# Patient Record
Sex: Female | Born: 1945 | Race: White | Hispanic: No | Smoking: Former smoker
Health system: Southern US, Community
[De-identification: ages and names within clinical notes are randomized; demographics above are authoritative.]

## PROBLEM LIST (undated history)

## (undated) DIAGNOSIS — I10 Essential (primary) hypertension: Secondary | ICD-10-CM

## (undated) DIAGNOSIS — E079 Disorder of thyroid, unspecified: Secondary | ICD-10-CM

## (undated) HISTORY — PX: ABDOMINAL HYSTERECTOMY: SHX81

## (undated) HISTORY — PX: NASAL SINUS SURGERY: SHX719

## (undated) HISTORY — PX: BACK SURGERY: SHX140

---

## 1998-01-06 ENCOUNTER — Ambulatory Visit (HOSPITAL_COMMUNITY): Admission: RE | Admit: 1998-01-06 | Discharge: 1998-01-06 | Payer: Self-pay | Admitting: Gastroenterology

## 1998-12-29 ENCOUNTER — Other Ambulatory Visit: Admission: RE | Admit: 1998-12-29 | Discharge: 1998-12-29 | Payer: Self-pay | Admitting: Obstetrics and Gynecology

## 1999-02-15 ENCOUNTER — Ambulatory Visit (HOSPITAL_COMMUNITY): Admission: RE | Admit: 1999-02-15 | Discharge: 1999-02-15 | Payer: Self-pay | Admitting: Obstetrics and Gynecology

## 2000-05-13 ENCOUNTER — Other Ambulatory Visit: Admission: RE | Admit: 2000-05-13 | Discharge: 2000-05-13 | Payer: Self-pay | Admitting: Family Medicine

## 2000-06-28 ENCOUNTER — Encounter: Payer: Self-pay | Admitting: Internal Medicine

## 2000-06-28 ENCOUNTER — Emergency Department (HOSPITAL_COMMUNITY): Admission: RE | Admit: 2000-06-28 | Discharge: 2000-06-28 | Payer: Self-pay | Admitting: Internal Medicine

## 2000-08-08 ENCOUNTER — Ambulatory Visit (HOSPITAL_COMMUNITY): Admission: RE | Admit: 2000-08-08 | Discharge: 2000-08-08 | Payer: Self-pay

## 2000-08-30 ENCOUNTER — Encounter: Admission: RE | Admit: 2000-08-30 | Discharge: 2000-08-30 | Payer: Self-pay

## 2004-02-13 ENCOUNTER — Encounter: Admission: RE | Admit: 2004-02-13 | Discharge: 2004-02-13 | Payer: Self-pay | Admitting: Family Medicine

## 2005-01-18 ENCOUNTER — Encounter: Admission: RE | Admit: 2005-01-18 | Discharge: 2005-01-18 | Payer: Self-pay | Admitting: Orthopaedic Surgery

## 2009-08-24 ENCOUNTER — Emergency Department: Payer: Self-pay | Admitting: Emergency Medicine

## 2009-08-26 ENCOUNTER — Emergency Department: Payer: Self-pay

## 2009-08-28 ENCOUNTER — Ambulatory Visit: Payer: Self-pay | Admitting: Urology

## 2009-08-31 ENCOUNTER — Ambulatory Visit: Payer: Self-pay | Admitting: Urology

## 2009-09-14 ENCOUNTER — Ambulatory Visit: Payer: Self-pay | Admitting: Urology

## 2009-09-28 ENCOUNTER — Ambulatory Visit: Payer: Self-pay | Admitting: Urology

## 2010-05-26 ENCOUNTER — Encounter: Payer: Self-pay | Admitting: Orthopaedic Surgery

## 2015-10-17 ENCOUNTER — Other Ambulatory Visit: Payer: Self-pay | Admitting: Orthopaedic Surgery

## 2015-10-17 DIAGNOSIS — M545 Low back pain: Secondary | ICD-10-CM

## 2015-11-04 ENCOUNTER — Ambulatory Visit
Admission: RE | Admit: 2015-11-04 | Discharge: 2015-11-04 | Disposition: A | Discharge status: Home / Self Care | Payer: Medicare Other | Source: Ambulatory Visit | Attending: Orthopaedic Surgery | Admitting: Orthopaedic Surgery

## 2015-11-04 DIAGNOSIS — M545 Low back pain: Secondary | ICD-10-CM

## 2015-11-04 MED ORDER — GADOBENATE DIMEGLUMINE 529 MG/ML IV SOLN: 15.0000 mL | Freq: Once | INTRAVENOUS | Status: DC | PRN

## 2016-06-05 MED FILL — HYDROCODON-APAP 5-325: 5-325 | 15 days supply | Qty: 60 | Fill #0

## 2016-07-04 MED FILL — HYDROCODON-APAP 5-325: 5-325 | 15 days supply | Qty: 60 | Fill #0

## 2016-08-08 MED FILL — HYDROCODON-APAP 5-325: 5-325 | 15 days supply | Qty: 60 | Fill #0

## 2016-09-06 MED FILL — HYDROCODON-APAP 5-325: 5-325 | 15 days supply | Qty: 60 | Fill #0

## 2016-10-10 MED FILL — HYDROCODON-APAP 5-325: 5-325 | 15 days supply | Qty: 60 | Fill #0

## 2016-11-20 MED FILL — HYDROCODON-APAP 5-325: 5-325 | 15 days supply | Qty: 60 | Fill #0

## 2016-12-18 MED FILL — HYDROCODON-APAP 5-325: 5-325 | 15 days supply | Qty: 60 | Fill #0

## 2017-01-22 MED FILL — HYDROCODON-APAP 5-325: 5-325 | 15 days supply | Qty: 60 | Fill #0

## 2017-02-19 MED FILL — HYDROCODON-APAP 5-325: 5-325 | 15 days supply | Qty: 60 | Fill #0

## 2017-03-25 MED FILL — HYDROCODON-APAP 5-325: 5-325 | 15 days supply | Qty: 60 | Fill #0

## 2017-04-25 MED FILL — HYDROCODON-APAP 5-325: 5-325 | 15 days supply | Qty: 60 | Fill #0

## 2017-05-26 MED FILL — HYDROCODON-APAP 5-325: 5-325 | 15 days supply | Qty: 60 | Fill #0

## 2017-06-25 MED FILL — HYDROCODON-APAP 5-325: 5-325 | 15 days supply | Qty: 60 | Fill #0

## 2017-07-28 MED FILL — HYDROCODON-APAP 5-325: 5-325 | 15 days supply | Qty: 60 | Fill #0

## 2017-08-29 MED FILL — HYDROCODON-APAP 5-325: 5-325 | 15 days supply | Qty: 60 | Fill #0

## 2017-09-26 MED FILL — HYDROCODON-APAP 5-325: 5-325 | 15 days supply | Qty: 60 | Fill #0

## 2017-10-28 MED FILL — HYDROCODON-APAP 5-325: 5-325 | 15 days supply | Qty: 60 | Fill #0

## 2017-11-28 MED FILL — HYDROCODON-APAP 5-325: 5-325 | 15 days supply | Qty: 60 | Fill #0

## 2017-12-31 MED FILL — HYDROCODON-APAP 5-325: 5-325 | 15 days supply | Qty: 60 | Fill #0

## 2018-01-30 MED FILL — HYDROCODON-APAP 5-325: 5-325 | 15 days supply | Qty: 60 | Fill #0

## 2018-03-02 MED FILL — HYDROCODON-APAP 5-325: 5-325 | 15 days supply | Qty: 60 | Fill #0

## 2018-04-01 MED FILL — HYDROCODON-APAP 5-325: 5-325 | 15 days supply | Qty: 60 | Fill #0

## 2018-05-05 MED FILL — HYDROCODON-APAP 5-325: 5-325 | 15 days supply | Qty: 60 | Fill #0

## 2018-05-09 ENCOUNTER — Other Ambulatory Visit: Payer: Self-pay

## 2018-05-09 ENCOUNTER — Encounter: Payer: Self-pay | Admitting: Emergency Medicine

## 2018-05-09 ENCOUNTER — Emergency Department: Payer: Medicare Other

## 2018-05-09 ENCOUNTER — Emergency Department
Admission: EM | Admit: 2018-05-09 | Discharge: 2018-05-09 | Disposition: A | Discharge status: Home / Self Care | Payer: Medicare Other | Attending: Emergency Medicine | Admitting: Emergency Medicine

## 2018-05-09 DIAGNOSIS — R1010 Upper abdominal pain, unspecified: Secondary | ICD-10-CM | POA: Diagnosis present

## 2018-05-09 DIAGNOSIS — R110 Nausea: Secondary | ICD-10-CM

## 2018-05-09 DIAGNOSIS — Z87891 Personal history of nicotine dependence: Secondary | ICD-10-CM | POA: Diagnosis not present

## 2018-05-09 DIAGNOSIS — N390 Urinary tract infection, site not specified: Secondary | ICD-10-CM | POA: Diagnosis not present

## 2018-05-09 DIAGNOSIS — I10 Essential (primary) hypertension: Secondary | ICD-10-CM | POA: Insufficient documentation

## 2018-05-09 HISTORY — DX: Essential (primary) hypertension: I10

## 2018-05-09 HISTORY — DX: Disorder of thyroid, unspecified: E07.9

## 2018-05-09 LAB — CBC
HCT: 44.7 % (ref 36.0–46.0)
Hemoglobin: 15.1 g/dL — ABNORMAL HIGH (ref 12.0–15.0)
MCH: 30.8 pg (ref 26.0–34.0)
MCHC: 33.8 g/dL (ref 30.0–36.0)
MCV: 91 fL (ref 80.0–100.0)
Platelets: 362 10*3/uL (ref 150–400)
RBC: 4.91 MIL/uL (ref 3.87–5.11)
RDW: 12.5 % (ref 11.5–15.5)
WBC: 5.8 10*3/uL (ref 4.0–10.5)
nRBC: 0 % (ref 0.0–0.2)

## 2018-05-09 LAB — COMPREHENSIVE METABOLIC PANEL
ALT: 22 U/L (ref 0–44)
AST: 22 U/L (ref 15–41)
Albumin: 4.4 g/dL (ref 3.5–5.0)
Alkaline Phosphatase: 51 U/L (ref 38–126)
Anion gap: 9 (ref 5–15)
BILIRUBIN TOTAL: 0.6 mg/dL (ref 0.3–1.2)
BUN: 9 mg/dL (ref 8–23)
CO2: 25 mmol/L (ref 22–32)
Calcium: 9.5 mg/dL (ref 8.9–10.3)
Chloride: 103 mmol/L (ref 98–111)
Creatinine, Ser: 0.42 mg/dL — ABNORMAL LOW (ref 0.44–1.00)
GFR calc Af Amer: >60 mL/min (ref >60)
GFR calc non Af Amer: >60 mL/min (ref >60)
Glucose, Bld: 114 mg/dL — ABNORMAL HIGH (ref 70–99)
Potassium: 3.4 mmol/L — ABNORMAL LOW (ref 3.5–5.1)
Sodium: 137 mmol/L (ref 135–145)
Total Protein: 7.6 g/dL (ref 6.5–8.1)

## 2018-05-09 LAB — URINALYSIS, COMPLETE (UACMP) WITH MICROSCOPIC
Bilirubin Urine: NEGATIVE
Glucose, UA: NEGATIVE mg/dL
Ketones, ur: NEGATIVE mg/dL
Leukocytes, UA: NEGATIVE
Nitrite: NEGATIVE
PH: 7 (ref 5.0–8.0)
Protein, ur: NEGATIVE mg/dL
Specific Gravity, Urine: 1.012 (ref 1.005–1.030)

## 2018-05-09 LAB — LIPASE, BLOOD: Lipase: 30 U/L (ref 11–51)

## 2018-05-09 LAB — TROPONIN I: Troponin I: <0.03 ng/mL (ref <0.03)

## 2018-05-09 MED ORDER — ONDANSETRON 4 MG PO TBDP
4.0000 mg | ORAL_TABLET | Freq: Once | ORAL | Status: AC | PRN
  Administered 2018-05-09: 4 mg via ORAL
  Filled 2018-05-09: qty 1

## 2018-05-09 MED ORDER — CEPHALEXIN 500 MG PO CAPS: 500.0000 mg | ORAL_CAPSULE | Freq: Two times a day (BID) | ORAL | 0 refills | Status: DC

## 2018-05-09 MED ORDER — SODIUM CHLORIDE 0.9 % IV SOLN
1.0000 g | Freq: Once | INTRAVENOUS | Status: AC
  Administered 2018-05-09: 1 g via INTRAVENOUS
  Filled 2018-05-09: qty 10

## 2018-05-09 MED ORDER — IOPAMIDOL (ISOVUE-300) INJECTION 61%
100.0000 mL | Freq: Once | INTRAVENOUS | Status: AC | PRN
  Administered 2018-05-09: 100 mL via INTRAVENOUS

## 2018-05-09 MED ORDER — ONDANSETRON 4 MG PO TBDP: 4.0000 mg | ORAL_TABLET | Freq: Three times a day (TID) | ORAL | 0 refills | Status: DC | PRN

## 2018-05-09 MED ORDER — IOPAMIDOL (ISOVUE-300) INJECTION 61%
30.0000 mL | Freq: Once | INTRAVENOUS | Status: AC | PRN
  Administered 2018-05-09: 30 mL via ORAL

## 2018-05-09 MED ORDER — ALUM & MAG HYDROXIDE-SIMETH 200-200-20 MG/5ML PO SUSP
30.0000 mL | Freq: Once | ORAL | Status: AC
  Administered 2018-05-09: 30 mL via ORAL
  Filled 2018-05-09: qty 30

## 2018-05-09 MED ORDER — PANTOPRAZOLE SODIUM 40 MG PO TBEC: 40.0000 mg | DELAYED_RELEASE_TABLET | Freq: Every day | ORAL | 1 refills | Status: DC

## 2018-05-09 MED ORDER — LIDOCAINE VISCOUS HCL 2 % MT SOLN
15.0000 mL | Freq: Once | OROMUCOSAL | Status: AC
  Administered 2018-05-09: 15 mL via ORAL
  Filled 2018-05-09: qty 15

## 2018-05-09 MED ORDER — SODIUM CHLORIDE 0.9 % IV BOLUS
1000.0000 mL | Freq: Once | INTRAVENOUS | Status: AC
  Administered 2018-05-09: 1000 mL via INTRAVENOUS

## 2018-05-09 NOTE — ED Notes (Signed)
ED Provider at bedside. 

## 2018-05-09 NOTE — ED Notes (Signed)
Patient transported to CT 

## 2018-05-09 NOTE — ED Triage Notes (Signed)
Pt arrived with complaints of lower abdominal pain for the last 7 days. Pt states her PCP is out of town and she thought it would go away. Pt also reports n/v/d.

## 2018-05-09 NOTE — ED Provider Notes (Addendum)
[facility] Emergency Department Provider Note  Time seen: 12:12 PM  I have reviewed the triage vital signs and the nursing notes.   HISTORY  Chief Complaint Abdominal Pain    HPI Karla Pineda is a 73 y.o. female with a past medical history of hypertension, gastric reflux presents to the emergency department for upper abdominal pain and burning.  According to the patient 1 week ago she developed nausea vomiting and diarrhea along with upper abdominal discomfort.  States the nausea and vomiting have resolved she occasionally has a loose stool, but states she continues have progressively worsening pain and burning across the upper abdomen.  States it is worse when she attempts to eat.  Denies any fever.  Denies any further vomiting.  Patient does have a history of bad gastric reflux per patient.  Has not been taking her daily medications because she ran out of her reflux medications but states it is waiting for her at the pharmacy.  Denies any chest pain or trouble breathing.   Past Medical History:  Diagnosis Date  . Hypertension   . Thyroid disease     There are no active problems to display for this patient.   Past Surgical History:  Procedure Laterality Date  . ABDOMINAL HYSTERECTOMY    . BACK SURGERY    . NASAL SINUS SURGERY      Prior to Admission medications   Not on File    Not on File  No family history on file.  Social History Social History   Tobacco Use  . Smoking status: Former Games developer  . Smokeless tobacco: Never Used  Substance Use Topics  . Alcohol use: Never    Frequency: Never  . Drug use: Never    Review of Systems Constitutional: Negative for fever. Cardiovascular: Negative for chest pain. Respiratory: Negative for shortness of breath. Gastrointestinal: Upper abdominal burning.  Positive for nausea vomiting diarrhea which have largely resolved. Genitourinary: Negative for urinary compaints Musculoskeletal: Negative  for musculoskeletal complaints Skin: Negative for skin complaints  Neurological: Negative for headache All other ROS negative  ____________________________________________   PHYSICAL EXAM:  VITAL SIGNS: ED Triage Vitals  Enc Vitals Group     BP 05/09/18 0940 138/72     Pulse Rate 05/09/18 0940 100     Resp 05/09/18 0940 18     Temp 05/09/18 0940 98.2 F (36.8 C)     Temp Source 05/09/18 0940 Oral     SpO2 05/09/18 0940 98 %     Weight 05/09/18 0941 157 lb (71.2 kg)     Height 05/09/18 0941 5\' 3"  (1.6 m)     Head Circumference --      Peak Flow --      Pain Score 05/09/18 0941 8     Pain Loc --      Pain Edu? --      Excl. in GC? --    Constitutional: Alert and oriented. Well appearing and in no distress. Eyes: Normal exam ENT   Head: Normocephalic and atraumatic.   Mouth/Throat: Mucous membranes are moist. Cardiovascular: Normal rate, regular rhythm. No murmur Respiratory: Normal respiratory effort without tachypnea nor retractions. Breath sounds are clear Gastrointestinal: Soft, moderate upper abdominal tenderness palpation across the entire upper abdomen.  No rebound guarding or distention. Musculoskeletal: Nontender with normal range of motion in all extremities.  Neurologic:  Normal speech and language. No gross focal neurologic deficits  Skin:  Skin is warm, dry and intact.  Psychiatric:  Mood and affect are normal.   ____________________________________________   RADIOLOGY  IMPRESSION: No acute intra-abdominal pathology.  ____________________________________________   INITIAL IMPRESSION / ASSESSMENT AND PLAN / ED COURSE  Pertinent labs & imaging results that were available during my care of the patient were reviewed by me and considered in my medical decision making (see chart for details).  Patient presents to the emergency department for upper abdominal discomfort progressively worsening over the past 6 days.  Initially she had nausea vomiting  diarrhea though symptoms have largely resolved at this time.  Differential would include gastritis, gastric or peptic ulcer disease, colitis, pancreatitis, gallbladder disease.  Patient's labs are reassuring including normal LFTs, negative lipase.  I have added on a troponin as a precaution as well as an EKG.  Will obtain CT imaging of the abdomen/pelvis to further evaluate.  Patient does have moderate upper abdominal tenderness to palpation.  We will dose a GI cocktail monitor for symptom improvement.  Patient's lab work is largely within normal limits besides many bacteria seen on urinalysis.  We will treat with IV antibiotics.  Patient CT scan is negative.  We will discharge with a course of nausea medication if needed, antibiotics and Protonix.  Patient agreeable to plan of care.    ____________________________________________   FINAL CLINICAL IMPRESSION(S) / ED DIAGNOSES  Upper abdominal pain Nausea Urinary tract infection   Minna Antis, MD 05/09/18 1432    Minna Antis, MD 05/18/18 1345

## 2018-05-12 LAB — URINE CULTURE: Culture: >=100000 — AB

## 2018-06-05 MED FILL — HYDROCODON-APAP 5-325: 5-325 | 15 days supply | Qty: 60 | Fill #0

## 2018-06-12 ENCOUNTER — Emergency Department
Admission: EM | Admit: 2018-06-12 | Discharge: 2018-06-12 | Disposition: A | Discharge status: Home / Self Care | Payer: Medicare Other | Attending: Emergency Medicine | Admitting: Emergency Medicine

## 2018-06-12 ENCOUNTER — Other Ambulatory Visit: Payer: Self-pay

## 2018-06-12 ENCOUNTER — Emergency Department: Payer: Medicare Other

## 2018-06-12 ENCOUNTER — Encounter: Payer: Self-pay | Admitting: Emergency Medicine

## 2018-06-12 DIAGNOSIS — I10 Essential (primary) hypertension: Secondary | ICD-10-CM | POA: Diagnosis not present

## 2018-06-12 DIAGNOSIS — I8001 Phlebitis and thrombophlebitis of superficial vessels of right lower extremity: Secondary | ICD-10-CM

## 2018-06-12 DIAGNOSIS — Z87891 Personal history of nicotine dependence: Secondary | ICD-10-CM | POA: Insufficient documentation

## 2018-06-12 DIAGNOSIS — M79661 Pain in right lower leg: Secondary | ICD-10-CM | POA: Diagnosis present

## 2018-06-12 DIAGNOSIS — Z79899 Other long term (current) drug therapy: Secondary | ICD-10-CM | POA: Diagnosis not present

## 2018-06-12 NOTE — ED Provider Notes (Signed)
[facility] Emergency Department Provider Note  Time seen: 11:22 AM  I have reviewed the triage vital signs and the nursing notes.   HISTORY  Chief Complaint Leg Swelling    HPI Karla Pineda is a 73 y.o. female with a past medical history of hypertension presents to the emergency department for right medial calf pain.  According to the patient she woke up this morning with discomfort to her right medial calf, states there is a family history of blood clots she became concerned so she came to the emergency department for evaluation.  Denies any chest pain or shortness of breath.  Denies any prior blood clots.  Denies any blood thinner.  Describes the pain as mild dull pain, worse if she touches the area.   Past Medical History:  Diagnosis Date  . Hypertension   . Thyroid disease     There are no active problems to display for this patient.   Past Surgical History:  Procedure Laterality Date  . ABDOMINAL HYSTERECTOMY    . BACK SURGERY    . NASAL SINUS SURGERY      Prior to Admission medications   Medication Sig Start Date End Date Taking? Authorizing Provider  amLODipine (NORVASC) 10 MG tablet Take 10 mg by mouth daily.    [provider]  cephALEXin (KEFLEX) 500 MG capsule Take 1 capsule (500 mg total) by mouth 2 (two) times daily. 05/09/18   Minna Antis, MD  hydrochlorothiazide (HYDRODIURIL) 25 MG tablet Take 25 mg by mouth daily. 07/01/16   [provider]  HYDROcodone-acetaminophen (NORCO/VICODIN) 5-325 MG tablet Take 1 tablet by mouth every 6 (six) hours as needed for moderate pain.    [provider]  omeprazole (PRILOSEC) 20 MG capsule Take 20 mg by mouth daily.    [provider]  ondansetron (ZOFRAN ODT) 4 MG disintegrating tablet Take 1 tablet (4 mg total) by mouth every 8 (eight) hours as needed for nausea or vomiting. 05/09/18   Minna Antis, MD  pantoprazole (PROTONIX) 40 MG tablet Take 1 tablet  (40 mg total) by mouth daily. 05/09/18 07/08/18  Minna Antis, MD  ranitidine (ZANTAC) 150 MG tablet Take 75 mg by mouth 2 (two) times daily.    [provider]    No Known Allergies  History reviewed. No pertinent family history.  Social History Social History   Tobacco Use  . Smoking status: Former Games developer  . Smokeless tobacco: Never Used  Substance Use Topics  . Alcohol use: Never    Frequency: Never  . Drug use: Never    Review of Systems Constitutional: Negative for fever. Cardiovascular: Negative for chest pain. Respiratory: Negative for shortness of breath. Gastrointestinal: Minimal abdominal discomfort, chronic per patient Musculoskeletal: Mild right medial calf discomfort. Skin: No ecchymosis or contusions. Neurological: Negative for headache All other ROS negative  ____________________________________________   PHYSICAL EXAM:  VITAL SIGNS: ED Triage Vitals  Enc Vitals Group     BP 06/12/18 0939 (!) 149/89     Pulse Rate 06/12/18 0939 (!) 111     Resp 06/12/18 0939 18     Temp 06/12/18 0939 98.5 F (36.9 C)     Temp Source 06/12/18 0939 Oral     SpO2 06/12/18 0939 96 %     Weight 06/12/18 0941 160 lb (72.6 kg)     Height 06/12/18 0941 5\' 3"  (1.6 m)     Head Circumference --      Peak Flow --  Pain Score --      Pain Loc --      Pain Edu? --      Excl. in GC? --     Constitutional: Alert and oriented. Well appearing and in no distress. Eyes: Normal exam ENT   Head: Normocephalic and atraumatic.   Mouth/Throat: Mucous membranes are moist. Cardiovascular: Normal rate, regular rhythm.  Respiratory: Normal respiratory effort without tachypnea nor retractions. Breath sounds are clear Gastrointestinal: Soft and nontender. No distention.   Musculoskeletal: Mild tenderness to palpation of the right medial calf, no edema noted. Neurologic:  Normal speech and language. No gross focal neurologic deficits  Skin:  Skin is warm, dry and  intact.  Psychiatric: Mood and affect are normal.   ____________________________________________   RADIOLOGY  Ultrasound negative for DVT, positive for superficial thrombophlebitis of the right medial calf.  ____________________________________________   INITIAL IMPRESSION / ASSESSMENT AND PLAN / ED COURSE  Pertinent labs & imaging results that were available during my care of the patient were reviewed by me and considered in my medical decision making (see chart for details).  Patient presents emergency department for discomfort to the right medial calf.  Differential would include hematoma, contusion, DVT, thrombophlebitis. Ultrasound negative for DVT but positive for superficial thrombophlebitis.  I discussed with the patient using warm compresses for 20 to 30 minutes every 2-3 hours as needed for discomfort as well as over-the-counter Tylenol.  I also discussed PCP follow-up.  I discussed return precautions if the pain gets any worse or she develops swelling to return to the emergency department for repeat ultrasound.  Patient agreeable to plan of care.  ____________________________________________   FINAL CLINICAL IMPRESSION(S) / ED DIAGNOSES  Superficial thrombophlebitis   Minna Antis, MD 06/12/18 1124

## 2018-06-12 NOTE — ED Triage Notes (Signed)
Here for acute swelling and pressure to right leg. Called her doctor and sent for r/o DVT. No CP/SHOB.  Swelling noted.  VSS.

## 2018-07-03 MED FILL — HYDROCODON-APAP 5-325: 5-325 | 15 days supply | Qty: 60 | Fill #0

## 2018-08-05 MED FILL — HYDROCODON-APAP 5-325: 5-325 | 15 days supply | Qty: 60 | Fill #0

## 2018-09-02 MED FILL — HYDROCODON-APAP 5-325: 5-325 | 30 days supply | Qty: 60 | Fill #0

## 2018-10-07 MED FILL — HYDROCODON-APAP 5-325: 5-325 | 15 days supply | Qty: 60 | Fill #0

## 2018-11-06 MED FILL — HYDROCODON-APAP 5-325: 5-325 | 15 days supply | Qty: 60 | Fill #0

## 2018-12-08 MED FILL — HYDROCODON-APAP 5-325: 5-325 | 15 days supply | Qty: 60 | Fill #0

## 2019-01-06 MED FILL — HYDROCODON-APAP 5-325: 5-325 | 15 days supply | Qty: 60 | Fill #0

## 2019-01-19 ENCOUNTER — Ambulatory Visit: Payer: Medicare Other | Admitting: Orthopaedic Surgery

## 2019-01-26 ENCOUNTER — Encounter: Payer: Self-pay | Admitting: Orthopaedic Surgery

## 2019-01-26 ENCOUNTER — Ambulatory Visit (INDEPENDENT_AMBULATORY_CARE_PROVIDER_SITE_OTHER): Payer: Medicare Other | Admitting: Orthopaedic Surgery

## 2019-01-26 ENCOUNTER — Ambulatory Visit: Payer: Self-pay

## 2019-01-26 ENCOUNTER — Other Ambulatory Visit: Payer: Self-pay

## 2019-01-26 VITALS — BP 152/76 | HR 71 | Ht 63.0 in | Wt 160.0 lb

## 2019-01-26 DIAGNOSIS — M4722 Other spondylosis with radiculopathy, cervical region: Secondary | ICD-10-CM | POA: Diagnosis not present

## 2019-01-26 DIAGNOSIS — M542 Cervicalgia: Secondary | ICD-10-CM

## 2019-01-26 DIAGNOSIS — M25511 Pain in right shoulder: Secondary | ICD-10-CM

## 2019-01-26 DIAGNOSIS — M7541 Impingement syndrome of right shoulder: Secondary | ICD-10-CM | POA: Insufficient documentation

## 2019-01-26 MED ORDER — METHYLPREDNISOLONE ACETATE 40 MG/ML IJ SUSP
40.0000 mg | INTRAMUSCULAR | Status: AC | PRN
  Administered 2019-01-26: 40 mg via INTRA_ARTICULAR

## 2019-01-26 MED ORDER — BUPIVACAINE HCL 0.25 % IJ SOLN
4.0000 mL | INTRAMUSCULAR | Status: AC | PRN
  Administered 2019-01-26: 4 mL via INTRA_ARTICULAR

## 2019-01-26 MED ORDER — LIDOCAINE HCL 1 % IJ SOLN
0.5000 mL | INTRAMUSCULAR | Status: AC | PRN
  Administered 2019-01-26: .5 mL

## 2019-01-26 NOTE — Progress Notes (Signed)
Office Visit Note   Patient: Karla Pineda           Date of Birth: 1946/03/22           MRN: [id number] Visit Date: 01/26/2019              Requested by: Cyndi Bender, PA-C [address] [place],  [place] [address] PCP: Cyndi Bender, PA-C   Assessment & Plan: Visit Diagnoses:  1. Acute pain of right shoulder   2. Neck pain   3. Other spondylosis with radiculopathy, cervical region   4. Impingement syndrome of right shoulder     Plan: Subacromial injection performed with improvement in her pain.  Spondylosis in her neck with radiculopathy may be causing significant portion of her pain.  She is having persistent problems she will call about either starting some therapy or if she like to proceed with imaging studies.  Follow-Up Instructions: Return if symptoms worsen or fail to improve.   Orders:  Orders Placed This Encounter  Procedures  . Large Joint Inj: R subacromial bursa  . XR Cervical Spine 2 or 3 views  . XR Shoulder Right   No orders of the defined types were placed in this encounter.     Procedures: Large Joint Inj: R subacromial bursa on 01/26/2019 9:15 AM Indications: pain Details: 22 G 1.5 in needle  Arthrogram: No  Medications: 4 mL bupivacaine 0.25 %; 40 mg methylPREDNISolone acetate 40 MG/ML; 0.5 mL lidocaine 1 % Outcome: tolerated well, no immediate complications Procedure, treatment alternatives, risks and benefits explained, specific risks discussed. Consent was given by the patient. Immediately prior to procedure a time out was called to verify the correct patient, procedure, equipment, support staff and site/side marked as required. Patient was prepped and draped in the usual sterile fashion.       Clinical Data: No additional findings.   Subjective: Chief Complaint  Patient presents with  . Neck - Pain  . Right Shoulder - Pain    HPI 73 year old female returns she is had about 29-month history of right-sided neck pain right shoulder  pain difficulty getting her arm up overhead.  She is cleaning up the property pushing a barrel flat-footed in the dumpster and had increased pain in her shoulder with decreased range of motion.  Previous lumbar surgery at L1-L2 and she has intermittent times when her back is very painful.  She had 7-day prednisone pack which helped.  She is also used some hydrocodone occasionally.  She states when she looks up she gets dizzy and has increased neck pain.  Negative for falling history.  No chills or fever.  Review of Systems   Objective: Vital Signs: BP (!) 152/76   Pulse 71   Ht 5\' 3"  (1.6 m)   Wt 160 lb (72.6 kg)   BMI 28.34 kg/m   Physical Exam Constitutional:      Appearance: She is well-developed.  HENT:     Head: Normocephalic.     Right Ear: External ear normal.     Left Ear: External ear normal.  Eyes:     Pupils: Pupils are equal, round, and reactive to light.  Neck:     Thyroid: No thyromegaly.     Trachea: No tracheal deviation.  Cardiovascular:     Rate and Rhythm: Normal rate.  Pulmonary:     Effort: Pulmonary effort is normal.  Abdominal:     Palpations: Abdomen is soft.  Skin:    General: Skin is  warm and dry.  Neurological:     Mental Status: She is alert and oriented to person, place, and time.  Psychiatric:        Behavior: Behavior normal.     Ortho Exam well-healed lumbar incision.  She has some sciatic notch tenderness right and left as well as trochanteric bursal tenderness mild to moderate.  Severe brachial plexus tenderness on the right positive Spurling.  Abduction shoulder only 110 degrees.  Positive impingement negative Hawkins long head of the biceps is normal.  Upper semi-reflexes are 2+ and symmetrical.  Sensory testing to the arm is normal.  Specialty Comments:  No specialty comments available.  Imaging: Xr Cervical Spine 2 Or 3 Views  Result Date: 01/26/2019 AP and lateral cervical spine x-rays are obtained and reviewed.  This shows  straightening of the cervical spine.  C4-5 80% disc space collapse with anterior posterior spurring.  Mild spurring and narrowing at 4 5 and moderate at 5 6. Impression: Mid cervical spondylosis most significant at the C4-5 level with 2 mm retrolisthesis.  Xr Shoulder Right  Result Date: 01/26/2019 3 views right shoulder obtained and reviewed.  This shows some acromioclavicular degenerative changes.  Minimal glenoid spurring.  Right lung field is clear. Impression: Right shoulder shows minimal degenerative changes some acromioclavicular degenerative changes also noted.    PMFS History: Patient Active Problem List   Diagnosis Date Noted  . Other spondylosis with radiculopathy, cervical region 01/26/2019  . Impingement syndrome of right shoulder 01/26/2019   Past Medical History:  Diagnosis Date  . Hypertension   . Thyroid disease     No family history on file.  Past Surgical History:  Procedure Laterality Date  . ABDOMINAL HYSTERECTOMY    . BACK SURGERY    . NASAL SINUS SURGERY     Social History   Occupational History  . Not on file  Tobacco Use  . Smoking status: Former Games developer  . Smokeless tobacco: Never Used  Substance and Sexual Activity  . Alcohol use: Never    Frequency: Never  . Drug use: Never  . Sexual activity: Not on file

## 2019-02-09 MED FILL — HYDROCODON-APAP 5-325: 5-325 | 15 days supply | Qty: 60 | Fill #0

## 2019-03-11 MED FILL — HYDROCODON-APAP 5-325: 5-325 | 15 days supply | Qty: 60 | Fill #0

## 2019-04-07 ENCOUNTER — Other Ambulatory Visit: Payer: Self-pay

## 2019-04-07 ENCOUNTER — Ambulatory Visit: Payer: Self-pay

## 2019-04-07 ENCOUNTER — Ambulatory Visit: Payer: Medicare Other | Admitting: Surgery

## 2019-04-07 ENCOUNTER — Encounter: Payer: Self-pay | Admitting: Surgery

## 2019-04-07 DIAGNOSIS — M533 Sacrococcygeal disorders, not elsewhere classified: Secondary | ICD-10-CM

## 2019-04-07 DIAGNOSIS — M5441 Lumbago with sciatica, right side: Secondary | ICD-10-CM

## 2019-04-07 DIAGNOSIS — M5416 Radiculopathy, lumbar region: Secondary | ICD-10-CM

## 2019-04-07 DIAGNOSIS — G8929 Other chronic pain: Secondary | ICD-10-CM | POA: Diagnosis not present

## 2019-04-07 NOTE — Progress Notes (Signed)
Office Visit Note   Patient: Karla Pineda           Date of Birth: [birth year]           MRN: [id number] Visit Date: 04/07/2019              Requested by: Cyndi Bender, PA-C [address] [place],  [place] [address] PCP: Cyndi Bender, PA-C   Assessment & Plan: Visit Diagnoses:  1. Chronic right-sided low back pain with right-sided sciatica   2. Radiculopathy, lumbar region   3. Chronic right SI joint pain     Plan: With patient's ongoing worsening symptoms regarding her lumbar spine and failed conservative treatment I recommend getting lumbar MRI with and without contrast to rule out HNP/stenosis and this will be compared to previous study done in 2017.  Follow-up with Dr. Lorin Mercy after completion to discuss results and further treatment options.  In hopes of giving her some relief today I asked Dr. Junius Roads to perform an ultrasound-guided diagnostic/therapeutic right SI joint Marcaine/Depo-Medrol injection.  We will see how she feels with this when she returns for follow-up.  All questions answered.  Follow-Up Instructions: Return in about 3 weeks (around 04/28/2019) for with Dr Lorin Mercy to review lumbar MRI.   Orders:  Orders Placed This Encounter  Procedures  . XR Lumbar Spine 2-3 Views  . MR Lumbar Spine W Wo Contrast   No orders of the defined types were placed in this encounter.     Procedures: No procedures performed   Clinical Data: No additional findings.   Subjective: Chief Complaint  Patient presents with  . Lower Back - Pain    HPI 73 year old Y female with known history of lumbar spondylosis comes in with complaints of worsening right-sided low back pain and right lower extremity radiculopathy to the lower leg.  States that she has flareups of back pain every few months.  About 3 months ago she was seen by her PCP and was prescribed a prednisone taper and this did help relieve that flareup.  2 weeks ago she began having pain again in the area mentioned above.   No left leg symptoms.  No injury.  No complaints of bowel or bladder incontinence.  Right leg pain is constant but aggravated with prolonged sitting, walking, standing.  Describes a burning and tingling sensation in the leg.  She has also been taking Norco and ibuprofen without relief.  States that she had lumbar spine surgery in the 1990s.  Most recent lumbar MRI was done November 04, 2015 and that showed:  CLINICAL DATA:  Back pain, left hip pain for 2 years  EXAM: MRI LUMBAR SPINE WITHOUT AND WITH CONTRAST  TECHNIQUE: Multiplanar and multiecho pulse sequences of the lumbar spine were obtained without and with intravenous contrast.  CONTRAST:  15 mL MultiHance  COMPARISON:  07/15/2009  FINDINGS: Segmentation:  Standard.  Alignment:  No static listhesis.  Levoscoliosis of the lumbar spine.  Vertebrae:  No fracture, evidence of discitis, or bone lesion.  Conus medullaris: Extends to the L1 level and appears normal.  Paraspinal and other soft tissues: Negative.  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 Disc desiccation throughout the remainder of the lumbar spine. Reactive endplate changes at Q6-8.  T12-L1: Minimal broad-based disc bulge. No evidence of neural foraminal stenosis. No central canal stenosis.  L1-L2: Mild broad-based disc bulge. Moderate left facet arthropathy. Moderate left foraminal stenosis. No central canal stenosis.  L2-L3:  Moderate broad-based disc bulge with a left lateral disc osteophyte complex. Mild left foraminal stenosis. Mild bilateral facet arthropathy. Mild spinal stenosis.  L3-L4: Mild broad-based disc bulge. Moderate bilateral facet arthropathy with ligamentum flavum infolding resulting in mild spinal stenosis. Bilateral lateral recess stenosis. No evidence of neural foraminal stenosis.  L4-L5: Mild broad-based disc bulge. Mild bilateral facet arthropathy. Right lateral recess  stenosis. No evidence of neural foraminal stenosis. No central canal stenosis.  L5-S1: Right foraminal/lateral disc osteophyte complex. Severe right facet arthropathy. Mild right foraminal stenosis. No central canal stenosis.  IMPRESSION: 1. At L2-3 there is a moderate broad-based disc bulge with a left lateral disc osteophyte complex. Mild left foraminal stenosis. Mild bilateral facet arthropathy. Mild spinal stenosis. 2. At L3-4 there is a mild broad-based disc bulge. Moderate bilateral facet arthropathy with ligamentum flavum infolding resulting in mild spinal stenosis. Bilateral lateral recess stenosis. 3. At L4-5 there is a mild broad-based disc bulge. Mild bilateral facet arthropathy. Right lateral recess stenosis. 4. At L5-S1 there is a right right foraminal/lateral disc osteophyte complex. Severe right facet arthropathy. Mild right foraminal Stenosis.   At the time of her MRI she was only complaining of back pain and left hip pain.  She did have fairly significant findings on the right at L4-5 and right L5-S1 that may be contributing to her current problem now.   Review of Systems No current cardiac pulmonary GI GU issues  Objective: Vital Signs: There were no vitals taken for this visit.  Physical Exam HENT:     Head: Normocephalic and atraumatic.     Nose: Nose normal.  Eyes:     Extraocular Movements: Extraocular movements intact.     Pupils: Pupils are equal, round, and reactive to light.  Pulmonary:     Effort: No respiratory distress.  Abdominal:     General: There is no distension.  Musculoskeletal:     Comments: Gait is antalgic.  Patient has right low back discomfort getting up from a seated position.  She has moderate to markedly tender over the right SI joint.  Nontender over the left side.  Positive right-sided notch tenderness.  Negative logroll bilateral hips.  Positive right straight leg raise.  Negative on the left side.  Bilateral calves are  nontender.  Patient has trace right anterior tib and gastroc weakness.  All other strength maneuvers intact.  Neurological:     General: No focal deficit present.     Mental Status: She is alert and oriented to person, place, and time.  Psychiatric:        Mood and Affect: Mood normal.     Ortho Exam  Specialty Comments:  No specialty comments available.  Imaging: No results found.   PMFS History: Patient Active Problem List   Diagnosis Date Noted  . Other spondylosis with radiculopathy, cervical region 01/26/2019  . Impingement syndrome of right shoulder 01/26/2019   Past Medical History:  Diagnosis Date  . Hypertension   . Thyroid disease     History reviewed. No pertinent family history.  Past Surgical History:  Procedure Laterality Date  . ABDOMINAL HYSTERECTOMY    . BACK SURGERY    . NASAL SINUS SURGERY     Social History   Occupational History  . Not on file  Tobacco Use  . Smoking status: Former Games developer  . Smokeless tobacco: Never Used  Substance and Sexual Activity  . Alcohol use: Never    Frequency: Never  . Drug use: Never  . Sexual  activity: Not on file

## 2019-04-07 NOTE — Progress Notes (Signed)
Subjective: She is here for right-sided SI injection.  She is having pain when standing upright, pain shooting down the leg toward the calf.  She gets a burning and tingling sensation.  She is awaiting MRI of lumbar spine.  Objective: She is tender over the mid to inferior right SI joint and in the right gluteus medius area.  Procedure: Right SI joint injection: After sterile prep with Betadine, injected 5 cc 1% lidocaine without epinephrine and 40 mg methylprednisolone using a 22-gauge spinal needle and ultrasound to guide needle placement into the SI joint.  She had good but not complete relief of pain during the immediate anesthetic phase.  She will follow-up with Benjiman Core as scheduled.

## 2019-04-12 MED FILL — HYDROCODON-APAP 5-325: 5-325 | 15 days supply | Qty: 60 | Fill #0

## 2019-05-01 ENCOUNTER — Inpatient Hospital Stay: Admission: RE | Admit: 2019-05-01 | Payer: Medicare Other | Source: Ambulatory Visit

## 2019-05-03 ENCOUNTER — Telehealth: Payer: Self-pay | Admitting: Orthopaedic Surgery

## 2019-05-03 NOTE — Telephone Encounter (Signed)
Patient's daughter called. The machine at the facility her MRI was ordered to be done at is down. Her MRI review is scheduled for tomorrow. She would like to be advised on her next step, possibly being referred to another place.   Call back number: [phone]

## 2019-05-03 NOTE — Telephone Encounter (Signed)
Please see message below.  Do you know when and if we may be able to get patient an appointment for MRI since magnet is down?

## 2019-05-03 NOTE — Telephone Encounter (Signed)
Pt is scheduled already on the 05/13/2019 at [facility] imaging, does she want something sooner?

## 2019-05-03 NOTE — Telephone Encounter (Signed)
They are requesting something sooner if possible.

## 2019-05-03 NOTE — Telephone Encounter (Signed)
Spoke with pt daughter Levada Dy and I told her I will try to gt her in over at [facility] for this weekend. I skyped Destiny B at [employer] advising her of the order

## 2019-05-04 ENCOUNTER — Ambulatory Visit: Payer: Medicare Other | Admitting: Orthopaedic Surgery

## 2019-05-11 ENCOUNTER — Ambulatory Visit: Payer: Medicare Other | Admitting: Orthopaedic Surgery

## 2019-05-13 ENCOUNTER — Other Ambulatory Visit: Payer: Medicare Other

## 2019-05-13 MED FILL — HYDROCODON-APAP 5-325: 5-325 | 15 days supply | Qty: 60 | Fill #0

## 2019-05-14 ENCOUNTER — Other Ambulatory Visit: Payer: Medicare Other

## 2019-05-18 ENCOUNTER — Ambulatory Visit: Payer: Medicare Other | Admitting: Orthopaedic Surgery

## 2019-06-05 ENCOUNTER — Other Ambulatory Visit: Payer: Self-pay

## 2019-06-05 ENCOUNTER — Ambulatory Visit
Admission: RE | Admit: 2019-06-05 | Discharge: 2019-06-05 | Disposition: A | Discharge status: Home / Self Care | Payer: Medicare Other | Source: Ambulatory Visit | Attending: Surgery | Admitting: Surgery

## 2019-06-05 DIAGNOSIS — G8929 Other chronic pain: Secondary | ICD-10-CM

## 2019-06-05 DIAGNOSIS — M5416 Radiculopathy, lumbar region: Secondary | ICD-10-CM

## 2019-06-05 MED ORDER — GADOBENATE DIMEGLUMINE 529 MG/ML IV SOLN
15.0000 mL | Freq: Once | INTRAVENOUS | Status: AC | PRN
  Administered 2019-06-05: 15 mL via INTRAVENOUS

## 2019-06-08 ENCOUNTER — Encounter: Payer: Self-pay | Admitting: Orthopaedic Surgery

## 2019-06-08 ENCOUNTER — Other Ambulatory Visit: Payer: Self-pay

## 2019-06-08 ENCOUNTER — Ambulatory Visit: Payer: Medicare Other | Admitting: Orthopaedic Surgery

## 2019-06-08 DIAGNOSIS — M4126 Other idiopathic scoliosis, lumbar region: Secondary | ICD-10-CM | POA: Diagnosis not present

## 2019-06-08 DIAGNOSIS — M48061 Spinal stenosis, lumbar region without neurogenic claudication: Secondary | ICD-10-CM

## 2019-06-08 DIAGNOSIS — M419 Scoliosis, unspecified: Secondary | ICD-10-CM | POA: Insufficient documentation

## 2019-06-08 NOTE — Addendum Note (Signed)
Addended by: Rogers Seeds on: 06/08/2019 08:59 AM   Modules accepted: Orders

## 2019-06-08 NOTE — Progress Notes (Signed)
Office Visit Note   Patient: Karla Pineda           Date of Birth: 1945/10/16           MRN: [id number] Visit Date: 06/08/2019              Requested by: Lonie Peak, PA-C [address] [place],  [place] [address] PCP: Lonie Peak, PA-C   Assessment & Plan: Visit Diagnoses:  1. Lumbar foraminal stenosis   2. Other idiopathic scoliosis, lumbar region     Plan: Patient is having trouble doing her housework she lives alone problems walking going to the store pain makes it difficult for her to go to sleep.  Patient said symptoms since November 15 severe.  She not responded to anti-inflammatories she has gone through exercise program from previous therapy without relief and completed home exercise program.  She has had sacroiliac and joint injection under ultrasound without relief.  We reviewed x-rays which show progression of lumbar curvature in 2001-2021.  Previous microdiscectomy site shows no areas of residual compression.  She does have some narrowing on the left from L1-L3 and the foramina but no left-sided symptoms.  Corresponding areas compression with the right side symptoms are L4-5 where she has moderate right lateral recess stenosis and moderate right foraminal stenosis.  L5-S1 shows right lateral recess stenosis with stable severe right L5 foraminal stenosis.  We will set her up for an epidural injection with Dr. Alvester Morin likely foraminal right L4 and L5.  She can follow-up with me after the injection.  Follow-Up Instructions: No follow-ups on file.   Orders:  No orders of the defined types were placed in this encounter.  No orders of the defined types were placed in this encounter.     Procedures: No procedures performed   Clinical Data: No additional findings.   Subjective: Chief Complaint  Patient presents with  . Lower Back - Pain, Follow-up    MRI Lumbar Review    HPI 74 year old female returns post SI joint injection on the right with no improvement in  her pain symptoms.  She has MRI scan has been obtained she has scoliosis which is progressed from 2011 with progression of lumbar curvature.  She is having symptoms in the right that radiates into her right buttocks sometimes down to her right lateral calf.  Most time is more proximal in the buttocks and proximal thigh.  She notices problems going up steps she cannot get comfortable when she were stands up is hard to get straight.  MRI scan shows foraminal stenosis with compression of right L4 and L5 nerve root.  She has some compression on the left side above that but no left-sided symptoms.  Patient had previous right microdiscectomy L1-2 level sometime in the late 80s or early 1990s by [place].  Previous SI joint injection 04/07/2019 was not effective in helping her pain.  Review of Systems 14 point system update unchanged from 04/07/2019 office visit.   Objective: Vital Signs: BP (!) 145/88   Pulse 90   Ht 5\' 3"  (1.6 m)   Wt 162 lb (73.5 kg)   BMI 28.70 kg/m   Physical Exam Constitutional:      Appearance: She is well-developed.  HENT:     Head: Normocephalic.     Right Ear: External ear normal.     Left Ear: External ear normal.  Eyes:     Pupils: Pupils are equal, round, and reactive to light.  Neck:  Thyroid: No thyromegaly.     Trachea: No tracheal deviation.  Cardiovascular:     Rate and Rhythm: Normal rate.  Pulmonary:     Effort: Pulmonary effort is normal.  Abdominal:     Palpations: Abdomen is soft.  Skin:    General: Skin is warm and dry.  Neurological:     Mental Status: She is alert and oriented to person, place, and time.  Psychiatric:        Behavior: Behavior normal.     Ortho Exam patient slow getting from sitting to standing has trouble getting fully erect for a few seconds.  She has negative straight leg raising right side 90 degrees.  Anterior tib EHL is intact knee and ankle jerk are 1+ and symmetrical.  Negative FABER test.  Sciatic notch tenderness on the  right and over the right SI joint.  Specialty Comments:  No specialty comments available.  Imaging: Study Result  CLINICAL DATA:  74 year old female with progressive low back pain, right lower extremity radiculopathy. Right anterior tibia and gastrocnemius weakness.  Creatinine was obtained on site at [facility] at [address]  Results: Creatinine 0.7 mg/dL.  EXAM: MRI LUMBAR SPINE WITHOUT AND WITH CONTRAST  TECHNIQUE: Multiplanar and multiecho pulse sequences of the lumbar spine were obtained without and with intravenous contrast.  CONTRAST:  22mL MULTIHANCE GADOBENATE DIMEGLUMINE 529 MG/ML IV SOLN  COMPARISON:  Lumbar radiographs 04/07/2019 lumbar MRI 11/04/2015.  FINDINGS: Segmentation:  Normal on the comparison radiographs.  Alignment: Moderate to severe dextroconvex lumbar scoliosis redemonstrated. Associated mild chronic straightening of lumbar lordosis. Superimposed mild grade 1 anterolisthesis in the lower thoracic spine at T10-T11.  Vertebrae: Degenerative endplate marrow edema and enhancement laterally on the left at L1-L2, L3-L4. Underlying chronic degenerative endplate marrow signal changes. Background bone marrow signal is within normal limits. Intact visible sacrum and SI joints.  Conus medullaris and cauda equina: Conus extends to the L1 level. No lower spinal cord or conus signal abnormality. No abnormal intradural enhancement. No dural thickening.  Paraspinal and other soft tissues: Negative visible abdominal viscera; benign right renal cysts. Negative visualized posterior paraspinal soft tissues.  Disc levels:  T10-T11: Grade 1 anterolisthesis with severe disc space loss, disc bulge, and posterior element hypertrophy. Mild spinal stenosis with possible mild cord mass effect (series 7, image 13). Moderate to severe right T10 foraminal stenosis.  T11-T12: Mild disc bulge. Right posterior element hypertrophy.  Mild to moderate right T11 foraminal stenosis.  T12-L1: Mild to moderate right facet hypertrophy. Borderline to mild right T12 foraminal stenosis appears stable.  L1-L2: Chronic disc space loss and left eccentric circumferential disc osteophyte complex. Moderate left facet hypertrophy. Mild left lateral recess stenosis appears increased (left L2 nerve level). Moderate to severe left L1 foraminal stenosis appears increased.  L2-L3: Severe disc space loss with left eccentric circumferential disc osteophyte complex. Broad-based posterior component. Mild mostly left side posterior element hypertrophy. Mild epidural lipomatosis. Stable mild spinal stenosis. Moderate to severe left L2 foraminal stenosis is stable to increased.  L3-L4: Chronic circumferential disc osteophyte complex and up to moderate posterior element hypertrophy. Increased mild spinal stenosis. Stable moderate to severe left and mild right L3 foraminal stenosis.  L4-L5: Chronic circumferential disc osteophyte complex and mild to moderate posterior element hypertrophy. Moderate right lateral recess stenosis appears increased. No significant spinal stenosis. Moderate right L4 foraminal stenosis is stable.  L5-S1: Chronic right eccentric circumferential disc osteophyte complex and moderate to severe right facet hypertrophy. Stable mild right lateral  recess stenosis without spinal stenosis. Stable severe right L5 foraminal stenosis.  IMPRESSION: 1. Chronic moderate to severe dextroconvex lumbar scoliosis with advanced lower thoracic and lumbar disc, endplate, and posterior element degeneration. Occasional degenerative endplate marrow edema. 2. Mild lower thoracic spinal stenosis with possible mild cord mass effect at T10-T11. No definite cord signal abnormality. 3. Progressed multifactorial stenosis at the L1-L2, and L2-L3 levels since the 2017 MRI. Up to mild spinal stenosis, but moderate or severe neural  foraminal stenosis at the right T12, left L1, left L2, left L3, right L4, and right L5 nerve levels.   Electronically Signed   By: Genevie Ann M.D.   On: 06/05/2019 21:23       PMFS History: Patient Active Problem List   Diagnosis Date Noted  . Lumbar foraminal stenosis 06/08/2019  . Scoliosis 06/08/2019  . Other spondylosis with radiculopathy, cervical region 01/26/2019  . Impingement syndrome of right shoulder 01/26/2019   Past Medical History:  Diagnosis Date  . Hypertension   . Thyroid disease     No family history on file.  Past Surgical History:  Procedure Laterality Date  . ABDOMINAL HYSTERECTOMY    . BACK SURGERY    . NASAL SINUS SURGERY     Social History   Occupational History  . Not on file  Tobacco Use  . Smoking status: Former Research scientist (life sciences)  . Smokeless tobacco: Never Used  Substance and Sexual Activity  . Alcohol use: Never  . Drug use: Never  . Sexual activity: Not on file

## 2019-06-14 MED FILL — HYDROCODON-APAP 5-325: 5-325 | 15 days supply | Qty: 60 | Fill #0

## 2019-06-21 ENCOUNTER — Ambulatory Visit: Payer: Self-pay

## 2019-06-21 ENCOUNTER — Other Ambulatory Visit: Payer: Self-pay

## 2019-06-21 ENCOUNTER — Ambulatory Visit: Payer: Medicare Other | Admitting: Physical Medicine and Rehabilitation

## 2019-06-21 ENCOUNTER — Encounter: Payer: Self-pay | Admitting: Physical Medicine and Rehabilitation

## 2019-06-21 VITALS — BP 140/90 | HR 77

## 2019-06-21 DIAGNOSIS — M5416 Radiculopathy, lumbar region: Secondary | ICD-10-CM | POA: Diagnosis not present

## 2019-06-21 MED ORDER — METHYLPREDNISOLONE ACETATE 80 MG/ML IJ SUSP
40.0000 mg | Freq: Once | INTRAMUSCULAR | Status: AC
  Administered 2019-06-21: 09:00:00 40 mg

## 2019-06-21 NOTE — Procedures (Signed)
Lumbosacral Transforaminal Epidural Steroid Injection - Sub-Pedicular Approach with Fluoroscopic Guidance  Patient: Karla Pineda      Date of Birth: 1946/03/15 MRN: [id number] PCP: Lonie Peak, PA-C      Visit Date: 06/21/2019   Universal Protocol:    Date/Time: 06/21/2019  Consent Given By: the patient  Position: PRONE  Additional Comments: Vital signs were monitored before and after the procedure. Patient was prepped and draped in the usual sterile fashion. The correct patient, procedure, and site was verified.   Injection Procedure Details:  Procedure Site One Meds Administered:  Meds ordered this encounter  Medications  . methylPREDNISolone acetate (DEPO-MEDROL) injection 40 mg    Laterality: Right  Location/Site:  L4-L5 L5-S1  Needle size: 22 G  Needle type: Spinal  Needle Placement: Transforaminal  Findings:    -Comments: Excellent flow of contrast along the nerve and into the epidural space.  Procedure Details: After squaring off the end-plates to get a true AP view, the C-arm was positioned so that an oblique view of the foramen as noted above was visualized. The target area is just inferior to the "nose of the scotty dog" or sub pedicular. The soft tissues overlying this structure were infiltrated with 2-3 ml. of 1% Lidocaine without Epinephrine.  The spinal needle was inserted toward the target using a "trajectory" view along the fluoroscope beam.  Under AP and lateral visualization, the needle was advanced so it did not puncture dura and was located close the 6 O'Clock position of the pedical in AP tracterory. Biplanar projections were used to confirm position. Aspiration was confirmed to be negative for CSF and/or blood. A 1-2 ml. volume of Isovue-250 was injected and flow of contrast was noted at each level. Radiographs were obtained for documentation purposes.   After attaining the desired flow of contrast documented above, a 0.5 to 1.0 ml test dose  of 0.25% Marcaine was injected into each respective transforaminal space.  The patient was observed for 90 seconds post injection.  After no sensory deficits were reported, and normal lower extremity motor function was noted,   the above injectate was administered so that equal amounts of the injectate were placed at each foramen (level) into the transforaminal epidural space.   Additional Comments:  The patient tolerated the procedure well Dressing: 2 x 2 sterile gauze and Band-Aid    Post-procedure details: Patient was observed during the procedure. Post-procedure instructions were reviewed.  Patient left the clinic in stable condition.

## 2019-06-21 NOTE — Progress Notes (Signed)
.  Numeric Pain Rating Scale and Functional Assessment Average Pain 7   In the last MONTH (on 0-10 scale) has pain interfered with the following?  1. General activity like being  able to carry out your everyday physical activities such as walking, climbing stairs, carrying groceries, or moving a chair?  Rating(7)   +Driver, -BT, -Dye Allergies.   

## 2019-06-21 NOTE — Progress Notes (Signed)
Karla Pineda - 74 y.o. female MRN [id number]  Date of birth: [birth year]  Office Visit Note: Visit Date: 06/21/2019 PCP: Lonie Peak, PA-C Referred by: Lonie Peak, PA-C  Subjective: Chief Complaint  Patient presents with  . Lower Back - Pain   HPI: Karla Pineda is a 74 y.o. female who comes in today At the request of Dr. Annell Greening for diagnostic and for therapeutic right L4 and L5 transforaminal epidural steroid injection for chronic worsening right hip and leg pain in a pretty classic L5 distribution.  She does have scoliosis with foraminal stenosis at L5 and lateral recess stenosis at L4-5.  The patient has failed conservative care including home exercise, medications, time and activity modification.  This injection will be diagnostic and hopefully therapeutic.  Please see requesting physician notes for further details and justification.   ROS Otherwise per HPI.  Assessment & Plan: Visit Diagnoses:  1. Lumbar radiculopathy     Plan: No additional findings.   Meds & Orders:  Meds ordered this encounter  Medications  . methylPREDNISolone acetate (DEPO-MEDROL) injection 40 mg    Orders Placed This Encounter  Procedures  . XR C-ARM NO REPORT  . Epidural Steroid injection    Follow-up: Return in about 2 weeks (around 07/05/2019) for Annell Greening, M.D..   Procedures: No procedures performed  Lumbosacral Transforaminal Epidural Steroid Injection - Sub-Pedicular Approach with Fluoroscopic Guidance  Patient: Karla Pineda      Date of Birth: 05-Sep-1945 MRN: [id number] PCP: Lonie Peak, PA-C      Visit Date: 06/21/2019   Universal Protocol:    Date/Time: 06/21/2019  Consent Given By: the patient  Position: PRONE  Additional Comments: Vital signs were monitored before and after the procedure. Patient was prepped and draped in the usual sterile fashion. The correct patient, procedure, and site was verified.   Injection Procedure Details:  Procedure Site  One Meds Administered:  Meds ordered this encounter  Medications  . methylPREDNISolone acetate (DEPO-MEDROL) injection 40 mg    Laterality: Right  Location/Site:  L4-L5 L5-S1  Needle size: 22 G  Needle type: Spinal  Needle Placement: Transforaminal  Findings:    -Comments: Excellent flow of contrast along the nerve and into the epidural space.  Procedure Details: After squaring off the end-plates to get a true AP view, the C-arm was positioned so that an oblique view of the foramen as noted above was visualized. The target area is just inferior to the "nose of the scotty dog" or sub pedicular. The soft tissues overlying this structure were infiltrated with 2-3 ml. of 1% Lidocaine without Epinephrine.  The spinal needle was inserted toward the target using a "trajectory" view along the fluoroscope beam.  Under AP and lateral visualization, the needle was advanced so it did not puncture dura and was located close the 6 O'Clock position of the pedical in AP tracterory. Biplanar projections were used to confirm position. Aspiration was confirmed to be negative for CSF and/or blood. A 1-2 ml. volume of Isovue-250 was injected and flow of contrast was noted at each level. Radiographs were obtained for documentation purposes.   After attaining the desired flow of contrast documented above, a 0.5 to 1.0 ml test dose of 0.25% Marcaine was injected into each respective transforaminal space.  The patient was observed for 90 seconds post injection.  After no sensory deficits were reported, and normal lower extremity motor function was noted,   the above injectate was administered so that equal amounts  of the injectate were placed at each foramen (level) into the transforaminal epidural space.   Additional Comments:  The patient tolerated the procedure well Dressing: 2 x 2 sterile gauze and Band-Aid    Post-procedure details: Patient was observed during the procedure. Post-procedure  instructions were reviewed.  Patient left the clinic in stable condition.      Clinical History: MRI LUMBAR SPINE WITHOUT AND WITH CONTRAST  TECHNIQUE: Multiplanar and multiecho pulse sequences of the lumbar spine were obtained without and with intravenous contrast.  CONTRAST:  16mL MULTIHANCE GADOBENATE DIMEGLUMINE 529 MG/ML IV SOLN  COMPARISON:  Lumbar radiographs 04/07/2019 lumbar MRI 11/04/2015.  FINDINGS: Segmentation:  Normal on the comparison radiographs.  Alignment: Moderate to severe dextroconvex lumbar scoliosis redemonstrated. Associated mild chronic straightening of lumbar lordosis. Superimposed mild grade 1 anterolisthesis in the lower thoracic spine at T10-T11.  Vertebrae: Degenerative endplate marrow edema and enhancement laterally on the left at L1-L2, L3-L4. Underlying chronic degenerative endplate marrow signal changes. Background bone marrow signal is within normal limits. Intact visible sacrum and SI joints.  Conus medullaris and cauda equina: Conus extends to the L1 level. No lower spinal cord or conus signal abnormality. No abnormal intradural enhancement. No dural thickening.  Paraspinal and other soft tissues: Negative visible abdominal viscera; benign right renal cysts. Negative visualized posterior paraspinal soft tissues.  Disc levels:  T10-T11: Grade 1 anterolisthesis with severe disc space loss, disc bulge, and posterior element hypertrophy. Mild spinal stenosis with possible mild cord mass effect (series 7, image 13). Moderate to severe right T10 foraminal stenosis.  T11-T12: Mild disc bulge. Right posterior element hypertrophy. Mild to moderate right T11 foraminal stenosis.  T12-L1: Mild to moderate right facet hypertrophy. Borderline to mild right T12 foraminal stenosis appears stable.  L1-L2: Chronic disc space loss and left eccentric circumferential disc osteophyte complex. Moderate left facet hypertrophy. Mild  left lateral recess stenosis appears increased (left L2 nerve level). Moderate to severe left L1 foraminal stenosis appears increased.  L2-L3: Severe disc space loss with left eccentric circumferential disc osteophyte complex. Broad-based posterior component. Mild mostly left side posterior element hypertrophy. Mild epidural lipomatosis. Stable mild spinal stenosis. Moderate to severe left L2 foraminal stenosis is stable to increased.  L3-L4: Chronic circumferential disc osteophyte complex and up to moderate posterior element hypertrophy. Increased mild spinal stenosis. Stable moderate to severe left and mild right L3 foraminal stenosis.  L4-L5: Chronic circumferential disc osteophyte complex and mild to moderate posterior element hypertrophy. Moderate right lateral recess stenosis appears increased. No significant spinal stenosis. Moderate right L4 foraminal stenosis is stable.  L5-S1: Chronic right eccentric circumferential disc osteophyte complex and moderate to severe right facet hypertrophy. Stable mild right lateral recess stenosis without spinal stenosis. Stable severe right L5 foraminal stenosis.  IMPRESSION: 1. Chronic moderate to severe dextroconvex lumbar scoliosis with advanced lower thoracic and lumbar disc, endplate, and posterior element degeneration. Occasional degenerative endplate marrow edema. 2. Mild lower thoracic spinal stenosis with possible mild cord mass effect at T10-T11. No definite cord signal abnormality. 3. Progressed multifactorial stenosis at the L1-L2, and L2-L3 levels since the 2017 MRI. Up to mild spinal stenosis, but moderate or severe neural foraminal stenosis at the right T12, left L1, left L2, left L3, right L4, and right L5 nerve levels.   Electronically Signed   By: Genevie Ann M.D.   On: 06/05/2019 21:23   She reports that she has quit smoking. She has never used smokeless tobacco. No results for input(s): HGBA1C, LABURIC in the  last 8760 hours.  Objective:  VS:  HT:    WT:   BMI:     BP:140/90  HR:77bpm  TEMP: ( )  RESP:  Physical Exam  Ortho Exam Imaging: XR C-ARM NO REPORT  Result Date: 06/21/2019 Please see Notes tab for imaging impression.   Past Medical/Family/Surgical/Social History: Medications & Allergies reviewed per EMR, new medications updated. Patient Active Problem List   Diagnosis Date Noted  . Lumbar foraminal stenosis 06/08/2019  . Scoliosis 06/08/2019  . Other spondylosis with radiculopathy, cervical region 01/26/2019  . Impingement syndrome of right shoulder 01/26/2019   Past Medical History:  Diagnosis Date  . Hypertension   . Thyroid disease    History reviewed. No pertinent family history. Past Surgical History:  Procedure Laterality Date  . ABDOMINAL HYSTERECTOMY    . BACK SURGERY    . NASAL SINUS SURGERY     Social History   Occupational History  . Not on file  Tobacco Use  . Smoking status: Former Games developer  . Smokeless tobacco: Never Used  Substance and Sexual Activity  . Alcohol use: Never  . Drug use: Never  . Sexual activity: Not on file

## 2019-07-15 MED FILL — HYDROCODON-APAP 7.5-325: 7.5-325 | 30 days supply | Qty: 60 | Fill #0

## 2019-07-27 ENCOUNTER — Other Ambulatory Visit: Payer: Self-pay

## 2019-07-27 ENCOUNTER — Ambulatory Visit: Payer: Medicare Other | Admitting: Orthopaedic Surgery

## 2019-07-27 ENCOUNTER — Encounter: Payer: Self-pay | Admitting: Orthopaedic Surgery

## 2019-07-27 VITALS — BP 140/86 | HR 71 | Ht 63.0 in | Wt 167.0 lb

## 2019-07-27 DIAGNOSIS — M48061 Spinal stenosis, lumbar region without neurogenic claudication: Secondary | ICD-10-CM

## 2019-07-27 DIAGNOSIS — M4126 Other idiopathic scoliosis, lumbar region: Secondary | ICD-10-CM | POA: Diagnosis not present

## 2019-07-27 DIAGNOSIS — M4722 Other spondylosis with radiculopathy, cervical region: Secondary | ICD-10-CM | POA: Diagnosis not present

## 2019-07-27 NOTE — Progress Notes (Signed)
Office Visit Note   Patient: Karla Pineda           Date of Birth: 06-Nov-1945           MRN: [id number] Visit Date: 07/27/2019              Requested by: Lonie Peak, PA-C [address] [place],  [place] [address] PCP: Lonie Peak, PA-C   Assessment & Plan: Visit Diagnoses:  1. Lumbar foraminal stenosis   2. Other spondylosis with radiculopathy, cervical region   3. Other idiopathic scoliosis, lumbar region     Plan: I discussed with the patient she has a complex problem with the scoliosis that she has with curvature and areas of narrowing.  Right L5 nerve root seems to be the problem currently for her however correction with instrumented fusion just below her curvature would require a much more extensive longer procedure and fusing multiple levels.  I like her to see Dr. Otelia Sergeant for evaluation and recommendations. I reviewed images and discussed with patient 11 of 3 years that this is something that I do not treat my practice multilevel fusions by Dr. Ferdie Ping may consider surgical treatment or decide if referral is appropriate. Follow-Up Instructions: No follow-ups on file.   Orders:  No orders of the defined types were placed in this encounter.  No orders of the defined types were placed in this encounter.     Procedures: No procedures performed   Clinical Data: No additional findings.   Subjective: Chief Complaint  Patient presents with  . Lower Back - Pain, Follow-up    HPI 74 year old female returns with ongoing problems with back pain right buttocks pain that radiates into the proximal thigh stops above the knee.  She has problems when she is up working for period of time has problems with the stairs and has had a falling episode when her right leg seems to give way she thinks it is her quadriceps.  Previous right microdiscectomy L1-L2 level back in 1980s or 90s.  Patient does have scoliosis with progressive curve and MRI scan in January showed mild narrowing at  T10-11.  Progressive multifactorial stenosis at L1-L2 and L2-L3 progressed since 2017.  Moderate right lateral recess stenosis with moderate right L4 foraminal stenosis stable.  L5-S1 severe right facet hypertrophy with lateral recess stenosis and stable severe right L5 foraminal stenosis.  She has failed conservative treatment.  She saw Dr. Alvester Morin in February who felt her symptoms are classic for L5 on the right.  Foraminal injections on the right L5 and S1 were not helpful with any pain relief.  Patient does have problems when she first gets from sitting to standing when she is up and moving about she does somewhat better.  After standing for period of time she has increased pain and if she is too active she feels like her right leg will give way and she will fall.  No associated bowel bladder symptoms. Patient lives alone and is concerned she is progressively having problems doing things she needs to do at home. Review of Systems previous problems with right shoulder impingement.  Negative for chest pain negative for CVA non-smoker.   Objective: Vital Signs: BP 140/86   Pulse 71   Ht 5\' 3"  (1.6 m)   Wt 167 lb (75.8 kg)   BMI 29.58 kg/m   Physical Exam Constitutional:      Appearance: She is well-developed.  HENT:     Head: Normocephalic.  Right Ear: External ear normal.     Left Ear: External ear normal.  Eyes:     Pupils: Pupils are equal, round, and reactive to light.  Neck:     Thyroid: No thyromegaly.     Trachea: No tracheal deviation.  Cardiovascular:     Rate and Rhythm: Normal rate.  Pulmonary:     Effort: Pulmonary effort is normal.  Abdominal:     Palpations: Abdomen is soft.  Skin:    General: Skin is warm and dry.  Neurological:     Mental Status: She is alert and oriented to person, place, and time.  Psychiatric:        Behavior: Behavior normal.     Ortho Exam patient slow getting from sitting to standing.  She reaches back to the right buttocks where she  has pain.  Negative straight leg raising 90 degrees on the right side.  Knee and ankle jerk 1+ and symmetrical.  Negative logroll to the hips.  Specialty Comments:  No specialty comments available.  Imaging: CLINICAL DATA:  74 year old female with progressive low back pain, right lower extremity radiculopathy. Right anterior tibia and gastrocnemius weakness.  Creatinine was obtained on site at [facility] at [address]  Results: Creatinine 0.7 mg/dL.  EXAM: MRI LUMBAR SPINE WITHOUT AND WITH CONTRAST  TECHNIQUE: Multiplanar and multiecho pulse sequences of the lumbar spine were obtained without and with intravenous contrast.  CONTRAST:  86mL MULTIHANCE GADOBENATE DIMEGLUMINE 529 MG/ML IV SOLN  COMPARISON:  Lumbar radiographs 04/07/2019 lumbar MRI 11/04/2015.  FINDINGS: Segmentation:  Normal on the comparison radiographs.  Alignment: Moderate to severe dextroconvex lumbar scoliosis redemonstrated. Associated mild chronic straightening of lumbar lordosis. Superimposed mild grade 1 anterolisthesis in the lower thoracic spine at T10-T11.  Vertebrae: Degenerative endplate marrow edema and enhancement laterally on the left at L1-L2, L3-L4. Underlying chronic degenerative endplate marrow signal changes. Background bone marrow signal is within normal limits. Intact visible sacrum and SI joints.  Conus medullaris and cauda equina: Conus extends to the L1 level. No lower spinal cord or conus signal abnormality. No abnormal intradural enhancement. No dural thickening.  Paraspinal and other soft tissues: Negative visible abdominal viscera; benign right renal cysts. Negative visualized posterior paraspinal soft tissues.  Disc levels:  T10-T11: Grade 1 anterolisthesis with severe disc space loss, disc bulge, and posterior element hypertrophy. Mild spinal stenosis with possible mild cord mass effect (series 7, image 13). Moderate to severe right T10  foraminal stenosis.  T11-T12: Mild disc bulge. Right posterior element hypertrophy. Mild to moderate right T11 foraminal stenosis.  T12-L1: Mild to moderate right facet hypertrophy. Borderline to mild right T12 foraminal stenosis appears stable.  L1-L2: Chronic disc space loss and left eccentric circumferential disc osteophyte complex. Moderate left facet hypertrophy. Mild left lateral recess stenosis appears increased (left L2 nerve level). Moderate to severe left L1 foraminal stenosis appears increased.  L2-L3: Severe disc space loss with left eccentric circumferential disc osteophyte complex. Broad-based posterior component. Mild mostly left side posterior element hypertrophy. Mild epidural lipomatosis. Stable mild spinal stenosis. Moderate to severe left L2 foraminal stenosis is stable to increased.  L3-L4: Chronic circumferential disc osteophyte complex and up to moderate posterior element hypertrophy. Increased mild spinal stenosis. Stable moderate to severe left and mild right L3 foraminal stenosis.  L4-L5: Chronic circumferential disc osteophyte complex and mild to moderate posterior element hypertrophy. Moderate right lateral recess stenosis appears increased. No significant spinal stenosis. Moderate right L4 foraminal stenosis is stable.  L5-S1:  Chronic right eccentric circumferential disc osteophyte complex and moderate to severe right facet hypertrophy. Stable mild right lateral recess stenosis without spinal stenosis. Stable severe right L5 foraminal stenosis.  IMPRESSION: 1. Chronic moderate to severe dextroconvex lumbar scoliosis with advanced lower thoracic and lumbar disc, endplate, and posterior element degeneration. Occasional degenerative endplate marrow edema. 2. Mild lower thoracic spinal stenosis with possible mild cord mass effect at T10-T11. No definite cord signal abnormality. 3. Progressed multifactorial stenosis at the L1-L2, and L2-L3  levels since the 2017 MRI. Up to mild spinal stenosis, but moderate or severe neural foraminal stenosis at the right T12, left L1, left L2, left L3, right L4, and right L5 nerve levels.   Electronically Signed   By: Genevie Ann M.D.   On: 06/05/2019 21:23   PMFS History: Patient Active Problem List   Diagnosis Date Noted  . Lumbar foraminal stenosis 06/08/2019  . Scoliosis 06/08/2019  . Other spondylosis with radiculopathy, cervical region 01/26/2019  . Impingement syndrome of right shoulder 01/26/2019   Past Medical History:  Diagnosis Date  . Hypertension   . Thyroid disease     No family history on file.  Past Surgical History:  Procedure Laterality Date  . ABDOMINAL HYSTERECTOMY    . BACK SURGERY    . NASAL SINUS SURGERY     Social History   Occupational History  . Not on file  Tobacco Use  . Smoking status: Former Research scientist (life sciences)  . Smokeless tobacco: Never Used  Substance and Sexual Activity  . Alcohol use: Never  . Drug use: Never  . Sexual activity: Not on file

## 2019-07-27 NOTE — Addendum Note (Signed)
Addended by: Rogers Seeds on: 07/27/2019 08:57 AM   Modules accepted: Orders

## 2019-08-11 ENCOUNTER — Ambulatory Visit: Payer: Medicare Other | Admitting: Specialist

## 2019-08-11 ENCOUNTER — Encounter: Payer: Self-pay | Admitting: Specialist

## 2019-08-11 ENCOUNTER — Other Ambulatory Visit: Payer: Self-pay

## 2019-08-11 VITALS — BP 136/78 | HR 70 | Ht 63.0 in | Wt 167.0 lb

## 2019-08-11 DIAGNOSIS — Z5321 Procedure and treatment not carried out due to patient leaving prior to being seen by health care provider: Secondary | ICD-10-CM

## 2019-08-13 MED FILL — HYDROCODON-APAP 7.5-325: 7.5-325 | 30 days supply | Qty: 60 | Fill #0

## 2019-08-17 ENCOUNTER — Encounter: Payer: Self-pay | Admitting: Specialist

## 2019-08-17 ENCOUNTER — Ambulatory Visit (INDEPENDENT_AMBULATORY_CARE_PROVIDER_SITE_OTHER): Payer: Medicare Other | Admitting: Specialist

## 2019-08-17 ENCOUNTER — Other Ambulatory Visit: Payer: Self-pay

## 2019-08-17 VITALS — BP 143/88 | HR 92 | Ht 63.0 in | Wt 167.0 lb

## 2019-08-17 DIAGNOSIS — M4722 Other spondylosis with radiculopathy, cervical region: Secondary | ICD-10-CM | POA: Diagnosis not present

## 2019-08-17 DIAGNOSIS — M533 Sacrococcygeal disorders, not elsewhere classified: Secondary | ICD-10-CM

## 2019-08-17 DIAGNOSIS — M48061 Spinal stenosis, lumbar region without neurogenic claudication: Secondary | ICD-10-CM

## 2019-08-17 DIAGNOSIS — M5416 Radiculopathy, lumbar region: Secondary | ICD-10-CM

## 2019-08-17 DIAGNOSIS — G8929 Other chronic pain: Secondary | ICD-10-CM

## 2019-08-17 DIAGNOSIS — M4126 Other idiopathic scoliosis, lumbar region: Secondary | ICD-10-CM

## 2019-08-17 MED ORDER — CELECOXIB 100 MG PO CAPS: 100.0000 mg | ORAL_CAPSULE | Freq: Two times a day (BID) | ORAL | 3 refills | Status: DC

## 2019-08-17 MED ORDER — PREGABALIN 50 MG PO CAPS: ORAL_CAPSULE | ORAL | 0 refills | Status: DC

## 2019-08-17 MED ORDER — PANTOPRAZOLE SODIUM 40 MG PO TBEC: 40.0000 mg | DELAYED_RELEASE_TABLET | Freq: Every day | ORAL | 1 refills | Status: DC

## 2019-08-17 NOTE — Progress Notes (Signed)
Office Visit Note   Patient: Karla Pineda           Date of Birth: 22-May-1945           MRN: [id number] Visit Date: 08/17/2019              Requested by: Lonie Peak, PA-C [address] [place],  [place] [address] PCP: Lonie Peak, PA-C   Assessment & Plan: Visit Diagnoses:  1. Other spondylosis with radiculopathy, cervical region   2. Chronic right SI joint pain   3. Radiculopathy, lumbar region   4. Lumbar foraminal stenosis   5. Other idiopathic scoliosis, lumbar region     Plan:Avoid bending, stooping and avoid lifting weights greater than 10 lbs. Avoid prolong standing and walking. Avoid frequent bending and stooping  No lifting greater than 10 lbs. May use ice or moist heat for pain. Weight loss is of benefit. Handicap license is approved. Physical therapy  Lyrica 50 mg once a day for one week then twice a day. Celebrex for arthritis pain.  Follow-Up Instructions: Return in about 4 weeks (around 09/14/2019).   Orders:  No orders of the defined types were placed in this encounter.  No orders of the defined types were placed in this encounter.     Procedures: No procedures performed   Clinical Data: No additional findings.   Subjective: Chief Complaint  Patient presents with  . Lower Back - Pain    Pan in right buttock into right leg to mid-shin, effects everyday life, from stairs to not being able to do stand up straight.    74 year old female with history of scoliosis and previous lumbar disc herniations she has had intermittant back pain for years and disc herniation surgery with good relief And did well till about 03/31/2019. When she had onset of back pain and pain into the right upper buttock and upper lumbar and thoracolumbar junction. No bowel or bladder Difficulty. She can walk a mile and she is able to grocery shop, tends to stoop and to feel better with bending. Has to sleep on the left side due to right low back apin. The right knee  gives away. She has 6 steps to house and right knee gives away. Doing the leaf blower she had the right knee give away and she notices she fell with dirt in her mouth. She had some esophageal narrowing and has had it dialated. Her pain today is "7" on a scale of 1-10. Has had only one disc herniation surgery geater than 10 years ago. She has seen  Dr. Ophelia Charter and he has tried 2 different steroid injections without relief. She has done yoga and light exercise program. Daughter notes she is out and in the yard walking almost daily. Weight is maintaining for the last 5 years. She is taking hydrocodone one in the late afternoon 7.5 mg. She care for grandchildren during the day. She is a widow about 4 years ago due to husband having a glioblastoma, surgery at [facility]. She get up several times per night to get comfortable. She can stay standing and have the pain improve. Sometimes going to sit increases the pain.    Review of Systems  Constitutional: Negative.  Negative for activity change, appetite change, chills, diaphoresis, fatigue, fever and unexpected weight change.  HENT: Positive for congestion, dental problem (On xray a nodule seen on left upper jaw and cheek), sinus pressure, sinus pain and sneezing. Negative for drooling, ear discharge, ear pain,  facial swelling, hearing loss, mouth sores, nosebleeds, postnasal drip, rhinorrhea, sore throat, tinnitus, trouble swallowing and voice change.   Eyes: Positive for itching. Negative for photophobia, pain, discharge, redness and visual disturbance.  Respiratory: Negative.  Negative for apnea, cough, choking, chest tightness, shortness of breath, wheezing and stridor.   Cardiovascular: Positive for leg swelling (left medial calf with thrombophlebitis). Negative for chest pain and palpitations.  Gastrointestinal: Negative.  Negative for abdominal distention, abdominal pain, anal bleeding, blood in stool, constipation, diarrhea, nausea, rectal pain and vomiting.   Endocrine: Negative.  Negative for cold intolerance, heat intolerance, polydipsia, polyphagia and polyuria.  Genitourinary: Positive for pelvic pain (Had bladder sling, occas leakage, surgery 2 y ago). Negative for difficulty urinating, dyspareunia, dysuria, enuresis, flank pain and urgency.  Musculoskeletal: Positive for back pain and gait problem. Negative for arthralgias, joint swelling, myalgias, neck pain and neck stiffness.  Skin: Negative.  Negative for color change, pallor, rash and wound.  Allergic/Immunologic: Positive for environmental allergies. Negative for food allergies and immunocompromised state.  Neurological: Positive for weakness and numbness (right anterior thigh and anterior shin, not foot).  Hematological: Negative for adenopathy. Does not bruise/bleed easily.  Psychiatric/Behavioral: Negative for agitation, behavioral problems, confusion, decreased concentration, dysphoric mood, hallucinations, self-injury, sleep disturbance and suicidal ideas. The patient is not nervous/anxious and is not hyperactive.      Objective: Vital Signs: BP (!) 143/88 (BP Location: Left Arm, Patient Position: Sitting)   Pulse 92   Ht 5\' 3"  (1.6 m)   Wt 167 lb (75.8 kg)   BMI 29.58 kg/m   Physical Exam Constitutional:      Appearance: She is well-developed.  HENT:     Head: Normocephalic and atraumatic.  Eyes:     Pupils: Pupils are equal, round, and reactive to light.  Pulmonary:     Effort: Pulmonary effort is normal.     Breath sounds: Normal breath sounds.  Abdominal:     General: Bowel sounds are normal.     Palpations: Abdomen is soft.  Musculoskeletal:        General: Normal range of motion.     Cervical back: Normal range of motion and neck supple.  Skin:    General: Skin is warm and dry.  Neurological:     Mental Status: She is alert and oriented to person, place, and time.  Psychiatric:        Behavior: Behavior normal.        Thought Content: Thought content  normal.        Judgment: Judgment normal.     Ortho Exam  Specialty Comments:  No specialty comments available.  Imaging: No results found.   PMFS History: Patient Active Problem List   Diagnosis Date Noted  . Lumbar foraminal stenosis 06/08/2019  . Scoliosis 06/08/2019  . Other spondylosis with radiculopathy, cervical region 01/26/2019  . Impingement syndrome of right shoulder 01/26/2019   Past Medical History:  Diagnosis Date  . Hypertension   . Thyroid disease     History reviewed. No pertinent family history.  Past Surgical History:  Procedure Laterality Date  . ABDOMINAL HYSTERECTOMY    . BACK SURGERY    . NASAL SINUS SURGERY     Social History   Occupational History  . Not on file  Tobacco Use  . Smoking status: Former Research scientist (life sciences)  . Smokeless tobacco: Never Used  Substance and Sexual Activity  . Alcohol use: Never  . Drug use: Never  . Sexual activity: Not on file

## 2019-08-17 NOTE — Patient Instructions (Signed)
Avoid bending, stooping and avoid lifting weights greater than 10 lbs. Avoid prolong standing and walking. Avoid frequent bending and stooping  No lifting greater than 10 lbs. May use ice or moist heat for pain. Weight loss is of benefit. Handicap license is approved. Physical therapy  Lyrica 50 mg once a day for one week then twice a day. Celebrex for arthritis pain.

## 2019-08-18 ENCOUNTER — Telehealth: Payer: Self-pay | Admitting: Specialist

## 2019-08-18 NOTE — Telephone Encounter (Signed)
Patient's daughter called stating that the PT facility in [facility] the patient wanted to go to.  The PT has not received the referral.  The fax #[phone].  CB#[phone].  Thank you.

## 2019-08-18 NOTE — Telephone Encounter (Signed)
Referral faxed to [phone], referral was just placed yesterday.

## 2019-08-18 NOTE — Telephone Encounter (Signed)
I called and advised that since this is going to and outside facility that we have to send it and we usually allow 2-3 business days to get those sent out as they are also working on getting other procedures scheduled such as MRI's, CT's and other test for other patients.  She states that she understands this. I did advise that this order has been sent as of 2pm today

## 2019-09-10 MED FILL — HYDROCODON-APAP 7.5-325: 7.5-325 | 30 days supply | Qty: 60 | Fill #0

## 2019-09-15 ENCOUNTER — Ambulatory Visit: Payer: Medicare Other | Admitting: Specialist

## 2019-10-21 ENCOUNTER — Ambulatory Visit: Payer: Medicare Other | Admitting: Specialist

## 2019-11-11 ENCOUNTER — Ambulatory Visit: Payer: Medicare Other | Admitting: Specialist

## 2019-11-11 MED FILL — HYDROCODON-APAP 7.5-325: 7.5-325 | 30 days supply | Qty: 60 | Fill #0

## 2019-12-10 MED FILL — HYDROCODON-APAP 7.5-325: 7.5-325 | 30 days supply | Qty: 60 | Fill #0

## 2019-12-16 ENCOUNTER — Ambulatory Visit: Payer: Medicare Other | Admitting: Specialist

## 2020-01-12 MED FILL — HYDROCODON-APAP 7.5-325: 7.5-325 | 30 days supply | Qty: 60 | Fill #0

## 2020-02-02 ENCOUNTER — Ambulatory Visit: Payer: Medicare Other | Admitting: Specialist

## 2020-02-08 ENCOUNTER — Other Ambulatory Visit (HOSPITAL_COMMUNITY): Payer: Self-pay | Admitting: Physician Assistant

## 2020-02-08 MED FILL — AMLODIPINE BESYLATE 10 MG T: 10 | 90 days supply | Qty: 90 | Fill #0

## 2020-02-09 ENCOUNTER — Other Ambulatory Visit (HOSPITAL_COMMUNITY): Payer: Self-pay | Admitting: Physician Assistant

## 2020-02-10 MED FILL — HYDROCODON-APAP 7.5-325: 7.5-325 | 30 days supply | Qty: 60 | Fill #0

## 2020-03-13 ENCOUNTER — Other Ambulatory Visit (HOSPITAL_COMMUNITY): Payer: Self-pay | Admitting: Physician Assistant

## 2020-03-13 MED FILL — HYDROCODON-APAP 7.5-325: 7.5-325 | 30 days supply | Qty: 60 | Fill #0

## 2020-04-11 ENCOUNTER — Other Ambulatory Visit (HOSPITAL_COMMUNITY): Payer: Self-pay | Admitting: Physician Assistant

## 2020-04-11 MED FILL — HYDROCODON-APAP 7.5-325: 7.5-325 | 30 days supply | Qty: 60 | Fill #0

## 2020-05-12 ENCOUNTER — Other Ambulatory Visit (HOSPITAL_COMMUNITY): Payer: Self-pay | Admitting: Physician Assistant

## 2020-05-12 MED FILL — HYDROCODON-APAP 7.5-325: 7.5-325 | 30 days supply | Qty: 60 | Fill #0

## 2020-06-13 ENCOUNTER — Other Ambulatory Visit (HOSPITAL_COMMUNITY): Payer: Self-pay | Admitting: Physician Assistant

## 2020-06-14 ENCOUNTER — Other Ambulatory Visit (HOSPITAL_COMMUNITY): Payer: Self-pay | Admitting: Physician Assistant

## 2020-06-15 MED FILL — HYDROCODON-APAP 7.5-325: 7.5-325 | 30 days supply | Qty: 60 | Fill #0

## 2020-07-17 ENCOUNTER — Other Ambulatory Visit (HOSPITAL_COMMUNITY): Payer: Self-pay | Admitting: Family Medicine

## 2020-08-11 ENCOUNTER — Other Ambulatory Visit (HOSPITAL_COMMUNITY): Payer: Self-pay

## 2020-08-11 MED ORDER — HYDROCODONE-ACETAMINOPHEN 7.5-325 MG PO TABS
1.0000 | ORAL_TABLET | Freq: Two times a day (BID) | ORAL | 0 refills | Status: DC
  Filled 2020-08-17: qty 60, 30d supply, fill #0

## 2020-08-17 ENCOUNTER — Other Ambulatory Visit (HOSPITAL_COMMUNITY): Payer: Self-pay

## 2020-08-29 ENCOUNTER — Encounter: Payer: Self-pay | Admitting: Physical Medicine and Rehabilitation

## 2020-09-14 ENCOUNTER — Other Ambulatory Visit (HOSPITAL_COMMUNITY): Payer: Self-pay

## 2020-09-14 MED ORDER — HYDROCODONE-ACETAMINOPHEN 7.5-325 MG PO TABS
1.0000 | ORAL_TABLET | Freq: Two times a day (BID) | ORAL | 0 refills | Status: DC
  Filled 2020-09-14: qty 60, 30d supply, fill #0

## 2020-09-15 ENCOUNTER — Other Ambulatory Visit (HOSPITAL_COMMUNITY): Payer: Self-pay

## 2020-10-13 ENCOUNTER — Other Ambulatory Visit (HOSPITAL_COMMUNITY): Payer: Self-pay

## 2020-10-16 ENCOUNTER — Other Ambulatory Visit (HOSPITAL_COMMUNITY): Payer: Self-pay

## 2020-10-16 MED ORDER — HYDROCODONE-ACETAMINOPHEN 7.5-325 MG PO TABS
1.0000 | ORAL_TABLET | Freq: Two times a day (BID) | ORAL | 0 refills | Status: DC
  Filled 2020-10-16: qty 60, 30d supply, fill #0

## 2020-10-20 ENCOUNTER — Encounter
Payer: Medicare Other | Attending: Physical Medicine and Rehabilitation | Admitting: Physical Medicine and Rehabilitation

## 2020-10-20 ENCOUNTER — Encounter: Payer: Self-pay | Admitting: Physical Medicine and Rehabilitation

## 2020-10-20 ENCOUNTER — Other Ambulatory Visit: Payer: Self-pay

## 2020-10-20 VITALS — BP 149/86 | HR 73 | Temp 98.7°F | Ht 63.0 in | Wt 163.0 lb

## 2020-10-20 DIAGNOSIS — G894 Chronic pain syndrome: Secondary | ICD-10-CM | POA: Diagnosis not present

## 2020-10-20 DIAGNOSIS — Z5181 Encounter for therapeutic drug level monitoring: Secondary | ICD-10-CM | POA: Insufficient documentation

## 2020-10-20 DIAGNOSIS — Z79891 Long term (current) use of opiate analgesic: Secondary | ICD-10-CM | POA: Insufficient documentation

## 2020-10-20 DIAGNOSIS — F5101 Primary insomnia: Secondary | ICD-10-CM | POA: Diagnosis present

## 2020-10-20 DIAGNOSIS — M4126 Other idiopathic scoliosis, lumbar region: Secondary | ICD-10-CM | POA: Insufficient documentation

## 2020-10-20 DIAGNOSIS — M48061 Spinal stenosis, lumbar region without neurogenic claudication: Secondary | ICD-10-CM | POA: Insufficient documentation

## 2020-10-20 MED ORDER — TRAZODONE HCL 50 MG PO TABS: 25.0000 mg | ORAL_TABLET | Freq: Every day | ORAL | 5 refills | Status: DC

## 2020-10-20 MED ORDER — DULOXETINE HCL 20 MG PO CPEP: 20.0000 mg | ORAL_CAPSULE | Freq: Every day | ORAL | 3 refills | Status: DC

## 2020-10-20 NOTE — Progress Notes (Signed)
Subjective:    Patient ID: Karla Pineda, female    DOB: 03/02/1946, 75 y.o.   MRN: [id number]  HPI Pt is a 75 yr old female with hx of HTN Significant for scoliosis, lumbar stenosis and DDD of lumbar spine.  Here for evaluation of chronic pain.    Only takes HCTZ, Norvasc, and Norco and occ Zyrtec  Most of pain in band across mid/low back.  Pain also radiates down the R leg to mid calf.  Also feels weak- will feel "weak" but sometimes gets catch".   Still does HEP they gave her from outpt PT.  Recent Hx of piriformis syndrome. 2 flare-up since 18 months ago when originally Dx'd.   Has also had 2 "attacks" where entire body was "flared up"- couldn't walk or lift arms above head. Got Prednisone x 7 days. Lasted 2 weeks everywhere.    Things tried: Has tried multiple medicines.  Tried Lyrica- think caused tremors Tried gabapentin 100 mg TID-  Buspar- was told had anxiety due to pain- didn't- had a side effect, but hasn't been recent, so didn't take long term.  Celebrex- that upset stomach Hasn't tried Cymbalta/Duloxetine Hasn't tried lidoderm Zanaflex- cannot drive or use it- makes her feel zoned out.   Takes Norco 7.5 mg/325mg  Even Dr Otelia Sergeant- has tried above things for pain.   Was told needed Surgery-  For low back- "7-8 hours".  Has put off, because doesn't want to get COVID.    So, got blood clots - superficial not DVT- in RLE- 3 weeks ago- was very swollen and And initially couldn't walk- has been 3 weeks since started Eliquis.   Lives in [place], [place]- lives alone.  Cannot stand up well when tands up Uses heating and ice packs and Biofreeze Does stretching  every morning to stand up well.  Wears back brace.  Walks dog- 15-20 minutes before needs to sit down Picks up grandchildren- 9 and 46 yrs old and watch them til 6pm- drives them to functions.  Does mowing- push mower- takes 1 week to finish-  Sitting in 1 place "kills her".    Poor sleep- sleeps 5  hours/night-  Husband died of brain cancer 6 yrs ago.  Never tried anything to get to sleep  Pain Inventory Average Pain 9 Pain Right Now 5 My pain is sharp, burning, stabbing, tingling, and aching  In the last 24 hours, has pain interfered with the following? General activity  ? Relation with others  ? Enjoyment of life  ? What TIME of day is your pain at its worst? morning , daytime, evening, and night Sleep (in general) NA  Pain is worse with: walking, standing, and some activites Pain improves with:  ? Relief from Meds:  ?  walk without assistance how many minutes can you walk? 15 ability to climb steps?  yes do you drive?  yes transfers alone Do you have any goals in this area?  yes  retired I need assistance with the following:  household duties and shopping  bladder control problems weakness numbness tingling trouble walking spasms dizziness anxiety  new  new    History reviewed. No pertinent family history. Social History   Socioeconomic History   Marital status: Married    Spouse name: Not on file   Number of children: Not on file   Years of education: Not on file   Highest education level: Not on file  Occupational History   Not on file  Tobacco Use  Smoking status: Former    Pack years: 0.00   Smokeless tobacco: Never  Substance and Sexual Activity   Alcohol use: Never   Drug use: Never   Sexual activity: Not on file  Other Topics Concern   Not on file  Social History Narrative   Not on file   Social Determinants of Health   Financial Resource Strain: Not on file  Food Insecurity: Not on file  Transportation Needs: Not on file  Physical Activity: Not on file  Stress: Not on file  Social Connections: Not on file   Past Surgical History:  Procedure Laterality Date   ABDOMINAL HYSTERECTOMY     BACK SURGERY     NASAL SINUS SURGERY     Past Medical History:  Diagnosis Date   Hypertension    Thyroid disease    BP (!)  149/86   Pulse 73   Temp 98.7 F (37.1 C)   Ht 5\' 3"  (1.6 m)   Wt 163 lb (73.9 kg)   SpO2 98%   BMI 28.87 kg/m   Opioid Risk Score:   Fall Risk Score:  `1  Depression screen PHQ 2/9  Depression screen PHQ 2/9 10/20/2020  Decreased Interest 0  Down, Depressed, Hopeless 1  PHQ - 2 Score 1  Altered sleeping 3  Tired, decreased energy 3  Change in appetite 0  Feeling bad or failure about yourself  0  Trouble concentrating 0  Moving slowly or fidgety/restless 0  Suicidal thoughts 0  PHQ-9 Score 7    Review of Systems  Constitutional:  Positive for diaphoresis.  HENT: Negative.    Eyes: Negative.   Respiratory: Negative.    Cardiovascular: Negative.   Gastrointestinal:  Positive for constipation and diarrhea.  Endocrine: Negative.   Genitourinary: Negative.   Musculoskeletal:  Positive for arthralgias, back pain, gait problem, neck pain and neck stiffness.  Skin: Negative.   Allergic/Immunologic: Negative.   Hematological: Negative.   Psychiatric/Behavioral: Negative.    All other systems reviewed and are negative.     Objective:   Physical Exam Awake, alert, appropriate, occ tangential, NAD MS: Ue's- deltoids 2+/5 (impingement) B/L , biceps 4-/5, triceps 4/5 B/L; WE and grip 4-/5 B/L , and finger abd 4+/5 B/L LE's- HF 2+/5 B/L (pain?) and KE 4/5 BL; DF and PF 4+/5 B/L   Feels weaker on R than Left.   Neuro: DTR's 2+ in arms, but 3+ at knees and ankles B/L No clonus, and no hoffman's B/L   Scoliosis to R lower thoracic- S spine- with associated muscle spasms/very palpable and actually visible Also Very TTP over band in low back  Extension of spine causes worsening pain. C/w stenosis.  Palpable trigger points in low back and around scoliosis.     Assessment & Plan:   Pt is a 75 yr old female with hx of HTN Significant for scoliosis, lumbar stenosis and DDD of lumbar spine.  Here for evaluation of chronic pain.   Will try 25-50 mg QHS Trazodone for sleep  as needed. For sleep- 1/2 to 1 tab nightly as needed.  Cymbalta/Duloxetine- Start Duloxetine/Cymbalta 20 mg nightly x 1 week Then 40 mg nightly x 1 week  Then 60 mg nightly- for nerve pain 1% of patients can have nausea with Duloxetine- call me if needs an anti-nausea medicine. Can also cause mild dry mouth/dry eyes and mild constipation. Don't start Trazodone and Duloxetine at the same time/day- give it a few days.  Get urine/oral drug screen and  opiate contract If screen ends up OK, will then take over pain medicine. Goals- to reduce dosage of pain meds overall, but will see if will help  F/U in 33months Call me in 1 week to remind me to fill the Norco- Wants Norco called to [facility] pharmacy-  11. Can try trigger point injections at next visit.    I spent a total of  45 minutes on this visit- discussing pain med options and side effect possibilities. And chronic pain issues.

## 2020-10-20 NOTE — Patient Instructions (Signed)
Pt is a 75 yr old female with hx of HTN Significant for scoliosis, lumbar stenosis and DDD of lumbar spine.  Here for evaluation of chronic pain.   Will try 25-50 mg QHS Trazodone for sleep as needed. For sleep- 1/2 to 1 tab nightly as needed.  Cymbalta/Duloxetine- Start Duloxetine/Cymbalta 20 mg nightly x 1 week Then 40 mg nightly x 1 week  Then 60 mg nightly- for nerve pain 1% of patients can have nausea with Duloxetine- call me if needs an anti-nausea medicine. Can also cause mild dry mouth/dry eyes and mild constipation. Don't start Trazodone and Duloxetine at the same time/day- give it a few days.  Get urine/oral drug screen and opiate contract If screen ends up OK, will then take over pain medicine. Goals- to reduce dosage of pain meds overall, but will see if will help  F/U in 48months Call me in 1 week to remind me to fill the Norco- Wants Norco called to [facility] pharmacy-

## 2020-10-28 LAB — TOXASSURE SELECT,+ANTIDEPR,UR

## 2020-10-30 ENCOUNTER — Telehealth: Payer: Self-pay | Admitting: *Deleted

## 2020-10-30 NOTE — Telephone Encounter (Signed)
Urine drug screen for this encounter is consistent for prescribed medication 

## 2020-11-14 ENCOUNTER — Other Ambulatory Visit: Payer: Self-pay | Admitting: Physical Medicine and Rehabilitation

## 2020-11-15 ENCOUNTER — Other Ambulatory Visit (HOSPITAL_COMMUNITY): Payer: Self-pay

## 2020-11-15 MED ORDER — HYDROCODONE-ACETAMINOPHEN 7.5-325 MG PO TABS
1.0000 | ORAL_TABLET | Freq: Two times a day (BID) | ORAL | 0 refills | Status: DC
  Filled 2020-11-15: qty 60, 30d supply, fill #0

## 2020-12-13 ENCOUNTER — Other Ambulatory Visit (HOSPITAL_COMMUNITY): Payer: Self-pay

## 2020-12-13 MED ORDER — HYDROCODONE-ACETAMINOPHEN 7.5-325 MG PO TABS
1.0000 | ORAL_TABLET | Freq: Two times a day (BID) | ORAL | 0 refills | Status: DC
  Filled 2020-12-13: qty 60, 30d supply, fill #0

## 2020-12-13 NOTE — Addendum Note (Signed)
Addended by: Genice Rouge on: 12/13/2020 09:25 AM   Modules accepted: Orders

## 2020-12-29 ENCOUNTER — Encounter
Payer: Medicare Other | Attending: Physical Medicine and Rehabilitation | Admitting: Physical Medicine and Rehabilitation

## 2020-12-29 ENCOUNTER — Encounter: Payer: Self-pay | Admitting: Physical Medicine and Rehabilitation

## 2020-12-29 ENCOUNTER — Other Ambulatory Visit (HOSPITAL_COMMUNITY): Payer: Self-pay

## 2020-12-29 ENCOUNTER — Other Ambulatory Visit: Payer: Self-pay

## 2020-12-29 VITALS — BP 148/81 | HR 67 | Temp 97.8°F | Ht 63.0 in | Wt 163.8 lb

## 2020-12-29 DIAGNOSIS — G894 Chronic pain syndrome: Secondary | ICD-10-CM | POA: Insufficient documentation

## 2020-12-29 DIAGNOSIS — G5701 Lesion of sciatic nerve, right lower limb: Secondary | ICD-10-CM | POA: Insufficient documentation

## 2020-12-29 DIAGNOSIS — M7918 Myalgia, other site: Secondary | ICD-10-CM | POA: Insufficient documentation

## 2020-12-29 DIAGNOSIS — M48061 Spinal stenosis, lumbar region without neurogenic claudication: Secondary | ICD-10-CM | POA: Diagnosis present

## 2020-12-29 MED ORDER — HYDROCODONE-ACETAMINOPHEN 7.5-325 MG PO TABS
1.0000 | ORAL_TABLET | Freq: Two times a day (BID) | ORAL | 0 refills | Status: DC
  Filled 2020-12-29 – 2021-01-10 (×2): qty 60, 30d supply, fill #0

## 2020-12-29 NOTE — Progress Notes (Signed)
Pt is a 75 yr old female with hx of HTN Significant for scoliosis, lumbar stenosis and DDD of lumbar spine.  Here for f/u of chronic pain.   Can't be spacy when keeps her grandkids- So stopped Trazodone and Duloxetine.   Taking Norco 7.5 mg/325 mg Gives her ~ 5 hours of relief- so can do housework and walk dog, etc.  Takes another 1 around 6pm- first at 10-11 am.   Standing in 1 place for a a short while "kills her"- can stand 1 hour MAX- feels like will vomit- and then lays down and will prop feet up.   Norco 2x/day helps her- allows her to function.    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  Going on right now.  Both piriformis  and R shoulder ar eon right.    Exam: Awake, alert, appropriate, Very point TTP over R piriformis as well as R upper trap, supraspinatus- as well as R splenius capitus and R rhomboids.    Plan: Will renew Norco 7.5 mg today- but not due til 9/7 - put on Rx.   2. Patient here for trigger point injections for  Consent done and on chart.  Cleaned areas with alcohol and injected using a 27 gauge 1.5 inch needle  Injected 3cc Using 1% Lidocaine with no EPI  Upper traps R only Levators R only Posterior scalenes Middle scalenes Splenius Capitus Pectoralis Major Rhomboids Infraspinatus Teres Major/minor Thoracic paraspinals Lumbar paraspinals R x1 Other injections- R deltoid, R piriformis x2   Patient's level of pain prior was 9/10 Current level of pain after injections is- no change yet. So far However R shoulder is now level with L side, which it wasn't initially.   There was no bleeding or complications.  Patient was advised to drink a lot of water on day after injections to flush system Will have increased soreness for 12-48 hours after injections.  Can use Lidocaine patches the day AFTER injections Can use theracane on day  of injections in places didn't inject Can use heating pad 4-6 hours AFTER injections  3. Needs to use tennis ball- sit on it for 15 minutes every day until pain better than as needed. For piriformis syndrome. Can also do on back of thighs as well. Also do stretch that we went over for piriformis- pull knee /hip externally rotated, to chest.     4. Theracane- didn't get it-  use 2-4 minutes on the muscle that hurt- hold pressure do not massage-  5. F/U in 3 months- if need be, can do trigger point injections

## 2020-12-29 NOTE — Patient Instructions (Signed)
  Plan: Will renew Norco 7.5 mg today- but not due til 9/7 - put on Rx.   2. Patient here for trigger point injections for  Consent done and on chart.  Cleaned areas with alcohol and injected using a 27 gauge 1.5 inch needle  Injected 3cc Using 1% Lidocaine with no EPI  Upper traps R only Levators R only Posterior scalenes Middle scalenes Splenius Capitus Pectoralis Major Rhomboids Infraspinatus Teres Major/minor Thoracic paraspinals Lumbar paraspinals R x1 Other injections- R deltoid, R piriformis x2   Patient's level of pain prior was 9/10 Current level of pain after injections is- no change yet. So far However R shoulder is now level with L side, which it wasn't initially.   There was no bleeding or complications.  Patient was advised to drink a lot of water on day after injections to flush system Will have increased soreness for 12-48 hours after injections.  Can use Lidocaine patches the day AFTER injections Can use theracane on day of injections in places didn't inject Can use heating pad 4-6 hours AFTER injections  3. Needs to use tennis ball- sit on it for 15 minutes every day until pain better than as needed. For piriformis syndrome. Can also do on back of thighs as well. Also do stretch that we went over for piriformis- pull knee /hip externally rotated, to chest.     4. Theracane- didn't get it-  use 2-4 minutes on the muscle that hurt- hold pressure do not massage-  5. F/U in 3 months- if need be, can do trigger point injections

## 2021-01-04 MED ORDER — PREDNISONE 20 MG PO TABS: 60.0000 mg | ORAL_TABLET | Freq: Every day | ORAL | 0 refills | Status: DC

## 2021-01-09 ENCOUNTER — Other Ambulatory Visit (HOSPITAL_COMMUNITY): Payer: Self-pay

## 2021-01-10 ENCOUNTER — Other Ambulatory Visit (HOSPITAL_COMMUNITY): Payer: Self-pay

## 2021-02-05 MED ORDER — HYDROCODONE-ACETAMINOPHEN 7.5-325 MG PO TABS: 1.0000 | ORAL_TABLET | Freq: Two times a day (BID) | ORAL | 0 refills | Status: DC

## 2021-02-05 NOTE — Addendum Note (Signed)
Addended by: Genice Rouge on: 02/05/2021 02:09 PM   Modules accepted: Orders

## 2021-02-07 ENCOUNTER — Other Ambulatory Visit (HOSPITAL_COMMUNITY): Payer: Self-pay

## 2021-02-07 MED ORDER — HYDROCODONE-ACETAMINOPHEN 7.5-325 MG PO TABS
1.0000 | ORAL_TABLET | Freq: Two times a day (BID) | ORAL | 0 refills | Status: DC
  Filled 2021-02-07: qty 60, 30d supply, fill #0

## 2021-02-07 NOTE — Addendum Note (Signed)
Addended by: Genice Rouge on: 02/07/2021 11:55 AM   Modules accepted: Orders

## 2021-02-07 NOTE — Telephone Encounter (Signed)
Please cancel Norco at [facility] and let pt know sent to [facility] outpt pharmacy- thanks- ML

## 2021-03-06 MED ORDER — HYDROCODONE-ACETAMINOPHEN 7.5-325 MG PO TABS
1.0000 | ORAL_TABLET | Freq: Two times a day (BID) | ORAL | 0 refills | Status: DC
  Filled 2021-03-06: qty 60, 30d supply, fill #0

## 2021-03-06 NOTE — Addendum Note (Signed)
Addended by: Genice Rouge on: 03/06/2021 07:24 PM   Modules accepted: Orders

## 2021-03-07 ENCOUNTER — Other Ambulatory Visit: Payer: Self-pay

## 2021-03-07 ENCOUNTER — Ambulatory Visit: Payer: Self-pay

## 2021-03-07 ENCOUNTER — Ambulatory Visit: Payer: Medicare Other | Admitting: Orthopaedic Surgery

## 2021-03-07 ENCOUNTER — Other Ambulatory Visit (HOSPITAL_COMMUNITY): Payer: Self-pay

## 2021-03-07 ENCOUNTER — Encounter: Payer: Self-pay | Admitting: Orthopaedic Surgery

## 2021-03-07 VITALS — BP 149/78 | Ht 63.0 in | Wt 160.0 lb

## 2021-03-07 DIAGNOSIS — M2241 Chondromalacia patellae, right knee: Secondary | ICD-10-CM | POA: Diagnosis not present

## 2021-03-07 DIAGNOSIS — M6281 Muscle weakness (generalized): Secondary | ICD-10-CM

## 2021-03-07 DIAGNOSIS — M25511 Pain in right shoulder: Secondary | ICD-10-CM | POA: Diagnosis not present

## 2021-03-07 DIAGNOSIS — M25512 Pain in left shoulder: Secondary | ICD-10-CM

## 2021-03-07 DIAGNOSIS — G8929 Other chronic pain: Secondary | ICD-10-CM

## 2021-03-07 DIAGNOSIS — M7541 Impingement syndrome of right shoulder: Secondary | ICD-10-CM

## 2021-03-07 NOTE — Progress Notes (Signed)
Office Visit Note   Patient: Karla Pineda           Date of Birth: Jan 13, 1946           MRN: [id number] Visit Date: 03/07/2021              Requested by: Lonie Peak, PA-C [address] [place],  [place] [address] PCP: Lonie Peak, PA-C   Assessment & Plan: Visit Diagnoses:  1. Chronic pain of both shoulders   2. Impingement syndrome of right shoulder   3. Chondromalacia patellae, right knee     Plan: Injection performed right subacromial space.  We will set her up for physical therapy in [place] at [facility] for quad strengthening right leg terminal arc quad 0 to 20 degrees straight leg raising isometrics etc.  They will need to avoid activities that load her patellofemoral joint trying to strengthen her quad.  Follow-Up Instructions: No follow-ups on file.   Orders:  Orders Placed This Encounter  Procedures   Large Joint Inj   XR Shoulder Left   XR Shoulder Right   No orders of the defined types were placed in this encounter.     Procedures: Large Joint Inj: R subacromial bursa on 03/07/2021 9:55 AM Indications: pain Details: 22 G 1.5 in needle  Arthrogram: No  Medications: 4 mL bupivacaine 0.25 %; 40 mg methylPREDNISolone acetate 40 MG/ML; 0.5 mL lidocaine 1 % Outcome: tolerated well, no immediate complications Procedure, treatment alternatives, risks and benefits explained, specific risks discussed. Consent was given by the patient. Immediately prior to procedure a time out was called to verify the correct patient, procedure, equipment, support staff and site/side marked as required. Patient was prepped and draped in the usual sterile fashion.      Clinical Data: No additional findings.   Subjective: Chief Complaint  Patient presents with   Right Knee - Pain    Fall 02/24/2021   Right Shoulder - Pain   Left Shoulder - Pain    HPI 75 year old female returns she has had episodes with her right leg giving way when she is walking.   Sometimes going up steps.  Occurred going down steps even when she hold onto the rail.  Her knee suddenly buckles.  No problems opposite left knee no numbness or tingling in her foot.  Other problem is been bilateral shoulder pain worse on her dominant right shoulder than left.  Pain with outstretch reaching abduction and she demonstrates Neer impingement test and states when she does this she has increased pain.  Patient has known cervical spondylosis but states other than crunching she has some mild neck discomfort but no numbness or tingling in her hands.  She has weakness in his shoulders and position of the rotator cuff is stressed.  Review of Systems all other systems are noncontributory HPI.  Past history of DVT right leg that resolved with treatment.   Objective: Vital Signs: BP (!) 149/78   Ht 5\' 3"  (1.6 m)   Wt 160 lb (72.6 kg)   BMI 28.34 kg/m   Physical Exam Constitutional:      Appearance: She is well-developed.  HENT:     Head: Normocephalic.     Right Ear: External ear normal.     Left Ear: External ear normal. There is no impacted cerumen.  Eyes:     Pupils: Pupils are equal, round, and reactive to light.  Neck:     Thyroid: No thyromegaly.     Trachea:  No tracheal deviation.  Cardiovascular:     Rate and Rhythm: Normal rate.  Pulmonary:     Effort: Pulmonary effort is normal.  Abdominal:     Palpations: Abdomen is soft.  Musculoskeletal:     Cervical back: No rigidity.     Left lower leg: No edema.  Skin:    General: Skin is warm and dry.  Neurological:     Mental Status: She is alert and oriented to person, place, and time.  Psychiatric:        Behavior: Behavior normal.    Ortho Exam negative Spurling reflexes 2+ symmetrical positive Panchal right shoulder worse than left shoulder.  Tenderness of the supraspinatus pain with resisted supraspinatus testing.  Crepitus with right knee extension quad weakness which I can overcome with 2 fingers left quad strong.   Minimal patella crepitus left knee.  Distal pulses are intact negative logroll right and left hips.  Specialty Comments:  No specialty comments available.  Imaging: XR Shoulder Left  Result Date: 03/07/2021 Three-view x-rays left shoulder obtained and reviewed.  Shows mild irregularity of the rotator cuff supraspinatus insertion site.  No glenohumeral arthritis.  Mild acromioclavicular changes. Impression: Left shoulder negative for acute changes.  XR Shoulder Right  Result Date: 03/07/2021 Right shoulder 3 view x-rays are obtained and reviewed.  This shows normal glenohumeral joint good position no degenerative changes.  Mild acromioclavicular narrowing.  No rotator cuff calcification. Impression: Normal right shoulder radiographs.    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  Past Surgical History:  Procedure Laterality Date   ABDOMINAL HYSTERECTOMY     BACK SURGERY     NASAL SINUS SURGERY     Social History   Occupational History   Not on file  Tobacco Use   Smoking status: Former   Smokeless tobacco: Never  Vaping Use   Vaping Use: Never used  Substance and Sexual Activity   Alcohol use: Not Currently   Drug use: Never   Sexual activity: Not on file

## 2021-03-09 ENCOUNTER — Ambulatory Visit: Payer: Medicare Other | Admitting: Orthopaedic Surgery

## 2021-03-09 MED ORDER — METHYLPREDNISOLONE ACETATE 40 MG/ML IJ SUSP
40.0000 mg | INTRAMUSCULAR | Status: AC | PRN
  Administered 2021-03-07: 40 mg via INTRA_ARTICULAR

## 2021-03-09 MED ORDER — BUPIVACAINE HCL 0.25 % IJ SOLN
4.0000 mL | INTRAMUSCULAR | Status: AC | PRN
  Administered 2021-03-07: 4 mL via INTRA_ARTICULAR

## 2021-03-09 MED ORDER — LIDOCAINE HCL 1 % IJ SOLN
0.5000 mL | INTRAMUSCULAR | Status: AC | PRN
Start: 2021-03-07 — End: 2021-03-07
  Administered 2021-03-07: .5 mL

## 2021-03-26 ENCOUNTER — Other Ambulatory Visit: Payer: Self-pay

## 2021-03-26 ENCOUNTER — Other Ambulatory Visit (HOSPITAL_COMMUNITY): Payer: Self-pay

## 2021-03-26 ENCOUNTER — Encounter
Payer: Medicare Other | Attending: Physical Medicine and Rehabilitation | Admitting: Physical Medicine and Rehabilitation

## 2021-03-26 ENCOUNTER — Encounter: Payer: Self-pay | Admitting: Physical Medicine and Rehabilitation

## 2021-03-26 VITALS — BP 144/82 | HR 104 | Ht 63.0 in | Wt 170.0 lb

## 2021-03-26 DIAGNOSIS — M6281 Muscle weakness (generalized): Secondary | ICD-10-CM

## 2021-03-26 DIAGNOSIS — M2241 Chondromalacia patellae, right knee: Secondary | ICD-10-CM | POA: Diagnosis present

## 2021-03-26 DIAGNOSIS — M48061 Spinal stenosis, lumbar region without neurogenic claudication: Secondary | ICD-10-CM

## 2021-03-26 DIAGNOSIS — G894 Chronic pain syndrome: Secondary | ICD-10-CM | POA: Diagnosis present

## 2021-03-26 MED ORDER — HYDROCODONE-ACETAMINOPHEN 7.5-325 MG PO TABS
1.0000 | ORAL_TABLET | Freq: Two times a day (BID) | ORAL | 0 refills | Status: DC
  Filled 2021-03-26 – 2021-04-06 (×2): qty 60, 30d supply, fill #0

## 2021-03-26 NOTE — Patient Instructions (Signed)
Plan: Needs  R knee hinged (stays)brace- measure half way up the thigh and half way down the knee- the circumference of the leg in both places- that will tell you what size to buy.   2. Use R knee brace whenever walking outside the house- I prefer to use a cane-quad cane.   3. Doesn't want to do trigger point injections- doesn't feel like that helped as much as wanted.    4. Not taking Trazodone and Duloxetine- took off medicine list.    5. Still taking Norvasc- won't take off list;    6. Con't Hydrocodone- 7.5/325 mg BID #60- next due early December- at [facility] outpt pharmacy  7. F/U in 3 months

## 2021-03-26 NOTE — Progress Notes (Signed)
Pt is a 75 yr old female with hx of HTN Significant for scoliosis, lumbar stenosis and DDD of lumbar spine.  Here for f/u of chronic pain.  Trigger point injections- lasted ~ 12-14 days- and were helpful-  Tried to use tennis balls  Larey Seat and hurt knee- and was laid up for a little bit.  Has Baker's cyst on R knee- R knee has been giving way for ~ 4 months.  Feel 7 steps- and sounded like cracked and hurt so bad- and went to ED- wasn't broken-  Has some "wear and tear" and arthritis- put in R knee brace and kept propped up for 1 week; and went back to Dr Ophelia Charter.  Didn't get MRI- got CT scan.  Dr Ophelia Charter- Ortho surgeon- wants to put in therapy- quad getting weak.  Insurance will only pay for 5 sessions.   Also cannot lift R shoulder/arm- thinks its all coming from neck, not shoulder.   Got prednisone shot- did help her lift her arm- and can now bathe self. And cook, and walk dog and go down steps.  Doesn't want to drain "baker's cyst"- because would come back-   Sick for 10 days- sinus stuff.    Has fallen 3 different times- due to R knee giving way.  Plans to keep walking- no matter what.   Lives alone.   Tennis ball will give some relief if uses 2 x/day every other day- will really make a difference-- if does too much, can cause bruising. Helps keep pain under better control when doesn't overuse.    Exam: Awake, alert, appropriate, sitting on table- no cane or brace with her, NAD  Can now raise R shoulder/arm to >90 degrees now since injection.  Still TTP over R piriformis; less trigger points in low back/mid back/and not in neck/upper back today.    Plan: Needs  R knee hinged (stays)brace- measure half way up the thigh and half way down the knee- the circumference of the leg in both places- that will tell you what size to buy.   2. Use R knee brace whenever walking outside the house- I prefer to use a cane-quad cane.   3. Doesn't want to do trigger point injections- doesn't  feel like that helped as much as wanted.    4. Not taking Trazodone and Duloxetine- took off medicine list.    5. Still taking Norvasc- won't take off list;    6. Con't Hydrocodone- 7.5/325 mg BID #60- next due early December- at [facility] outpt pharmacy  7. F/U in 3 months    I spent a total of 21 minutes on visit- as detailed above-

## 2021-04-06 ENCOUNTER — Other Ambulatory Visit (HOSPITAL_COMMUNITY): Payer: Self-pay

## 2021-04-30 ENCOUNTER — Other Ambulatory Visit: Payer: Self-pay | Admitting: Physical Medicine and Rehabilitation

## 2021-05-01 ENCOUNTER — Other Ambulatory Visit (HOSPITAL_COMMUNITY): Payer: Self-pay

## 2021-05-01 MED ORDER — HYDROCODONE-ACETAMINOPHEN 7.5-325 MG PO TABS
1.0000 | ORAL_TABLET | Freq: Two times a day (BID) | ORAL | 0 refills | Status: DC
  Filled 2021-05-01 – 2021-05-07 (×2): qty 60, 30d supply, fill #0

## 2021-05-01 NOTE — Telephone Encounter (Signed)
PMP report:  Filled  Written  ID  Drug  QTY  Days  Prescriber  RX #  Dispenser  Refill  Daily Dose*  Pymt Type  PMP  04/06/2021 03/26/2021 2  Hydrocodone-Acetamin 7.5-325 60.00 30 Me Lov [id number] Mos (0906) 0/0 15.00 MME Other [place]  MEDICATION: Hydrocodone   LAST APPOINTMENT DATE: @11 /21/2022  NEXT APPOINTMENT DATE:@2 /24/2023

## 2021-05-07 ENCOUNTER — Other Ambulatory Visit (HOSPITAL_COMMUNITY): Payer: Self-pay

## 2021-06-03 ENCOUNTER — Other Ambulatory Visit: Payer: Self-pay | Admitting: Physical Medicine and Rehabilitation

## 2021-06-03 ENCOUNTER — Encounter: Payer: Self-pay | Admitting: Physical Medicine and Rehabilitation

## 2021-06-04 ENCOUNTER — Other Ambulatory Visit (HOSPITAL_COMMUNITY): Payer: Self-pay

## 2021-06-04 MED ORDER — HYDROCODONE-ACETAMINOPHEN 7.5-325 MG PO TABS
1.0000 | ORAL_TABLET | Freq: Two times a day (BID) | ORAL | 0 refills | Status: DC
  Filled 2021-06-04: qty 60, 30d supply, fill #0

## 2021-06-06 ENCOUNTER — Other Ambulatory Visit (HOSPITAL_COMMUNITY): Payer: Self-pay

## 2021-06-29 ENCOUNTER — Other Ambulatory Visit: Payer: Self-pay

## 2021-06-29 ENCOUNTER — Encounter
Payer: Medicare Other | Attending: Physical Medicine and Rehabilitation | Admitting: Physical Medicine and Rehabilitation

## 2021-06-29 ENCOUNTER — Other Ambulatory Visit (HOSPITAL_COMMUNITY): Payer: Self-pay

## 2021-06-29 ENCOUNTER — Encounter: Payer: Self-pay | Admitting: Physical Medicine and Rehabilitation

## 2021-06-29 VITALS — BP 135/80 | HR 67 | Ht 63.0 in | Wt 164.0 lb

## 2021-06-29 DIAGNOSIS — M7541 Impingement syndrome of right shoulder: Secondary | ICD-10-CM | POA: Insufficient documentation

## 2021-06-29 DIAGNOSIS — M4722 Other spondylosis with radiculopathy, cervical region: Secondary | ICD-10-CM | POA: Diagnosis present

## 2021-06-29 DIAGNOSIS — M25511 Pain in right shoulder: Secondary | ICD-10-CM | POA: Diagnosis not present

## 2021-06-29 DIAGNOSIS — M48061 Spinal stenosis, lumbar region without neurogenic claudication: Secondary | ICD-10-CM | POA: Diagnosis not present

## 2021-06-29 DIAGNOSIS — G894 Chronic pain syndrome: Secondary | ICD-10-CM | POA: Diagnosis not present

## 2021-06-29 MED ORDER — HYDROCODONE-ACETAMINOPHEN 7.5-325 MG PO TABS
1.0000 | ORAL_TABLET | Freq: Three times a day (TID) | ORAL | 0 refills | Status: DC | PRN
Start: 2021-06-29 — End: 2021-07-30
  Filled 2021-06-29 – 2021-07-05 (×2): qty 90, 30d supply, fill #0

## 2021-06-29 MED ORDER — PREDNISONE 20 MG PO TABS
ORAL_TABLET | ORAL | 0 refills | Status: AC
  Filled 2021-06-29: qty 20, 12d supply, fill #0

## 2021-06-29 NOTE — Patient Instructions (Signed)
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  Here for f/u of chronic pain Also has disc collapse at C4/5 based on xray from 2020. Marland Kitchen    Went over that most of shoulder pain she's referring to is actually muscular- the R upper trapezius pain.  Suggest using -/hug big pillow.to take weight off R shoulder and muscles.    3.  Cannot have surgery for 3 months after oral prednisone. I suggest not having surgery unless have someone who can help recover.   4. Prednisone- 60 mg daily x 3 days, then 40 mg daily x 3 days; 20 mg x 3 days; then 10 mg daily x 3 days.  Cannot do again for another 5-6 months.   5. Change Norco to 7/5/325 mg up to 3x/day- will increase to 3x/day as needed. And if doesn't need it- then don't take 3rd dose. Average 3x/day as needed.    6.  F/U in 3 months

## 2021-06-29 NOTE — Progress Notes (Signed)
Subjective:    Patient ID: Karla Pineda, female    DOB: [birth year], 76 y.o.   MRN: [id number]  HPI  Pt is a 76 yr old female with hx of HTN Significant for scoliosis, lumbar stenosis and DDD of lumbar spine.  Here for f/u of chronic pain.    R shoulder is still acting up- was 10/10 this AM Took Norco this Am and pain came down some.   Got injection from Dr Ophelia Charter Didn't go back to see Dr Ophelia Charter- even though he said come back in 6 weeks, but didn't make another f/u.   Last got prednisone 9/22-  Had relief for ~ 37months. Almost made her feel normal.   Lives alone. And had shoulder injections.   Pain Inventory Average Pain 10 Pain Right Now 8 My pain is constant, sharp, burning, stabbing, tingling, and aching  In the last 24 hours, has pain interfered with the following? General activity 10 Relation with others 10 Enjoyment of life 10 What TIME of day is your pain at its worst? morning , daytime, evening, and night Sleep (in general) Poor  Pain is worse with: sitting, inactivity, standing, and some activites Pain improves with: heat/ice, therapy/exercise, and medication Relief from Meds: 5  No family history on file. Social History   Socioeconomic History   Marital status: Widowed    Spouse name: Not on file   Number of children: Not on file   Years of education: Not on file   Highest education level: Not on file  Occupational History   Not on file  Tobacco Use   Smoking status: Former   Smokeless tobacco: Never  Vaping Use   Vaping Use: Never used  Substance and Sexual Activity   Alcohol use: Not Currently   Drug use: Never   Sexual activity: Not on file  Other Topics Concern   Not on file  Social History Narrative   Not on file   Social Determinants of Health   Financial Resource Strain: Not on file  Food Insecurity: Not on file  Transportation Needs: Not on file  Physical Activity: Not on file  Stress: Not on file  Social Connections: Not on  file   Past Surgical History:  Procedure Laterality Date   ABDOMINAL HYSTERECTOMY     BACK SURGERY     NASAL SINUS SURGERY     Past Surgical History:  Procedure Laterality Date   ABDOMINAL HYSTERECTOMY     BACK SURGERY     NASAL SINUS SURGERY     Past Medical History:  Diagnosis Date   Hypertension    Thyroid disease    BP 135/80    Pulse 67    Ht 5\' 3"  (1.6 m)    Wt 164 lb (74.4 kg)    SpO2 98%    BMI 29.05 kg/m   Opioid Risk Score:   Fall Risk Score:  `1  Depression screen PHQ 2/9  Depression screen [facility] 2/9 03/26/2021 12/29/2020 10/20/2020  Decreased Interest 1 0 0  Down, Depressed, Hopeless 1 0 1  PHQ - 2 Score 2 0 1  Altered sleeping - - 3  Tired, decreased energy - - 3  Change in appetite - - 0  Feeling bad or failure about yourself  - - 0  Trouble concentrating - - 0  Moving slowly or fidgety/restless - - 0  Suicidal thoughts - - 0  PHQ-9 Score - - 7     Review of Systems  Constitutional:  Positive for fever.  Respiratory:  Positive for cough.   Musculoskeletal:  Positive for back pain and neck pain.       Bilateral shoulder pain Right arm pain Right knee pain Back of right leg pain Right hand numbness Back of right arm pain  Neurological:  Positive for weakness and numbness.  All other systems reviewed and are negative.     Objective:   Physical Exam  Awake, alert, appropriate, no cane today, NAD R shoulder appears slightly swollen- no effusion palpated Can abduct to 75 degrees and flex to 75-80 degrees but with pain Also associated trigger points in R upper traps and levators and scalenes. 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  Here for f/u of chronic pain Also has disc collapse at C4/5 based on xray from 2020. Marland Kitchen    Went over that most of shoulder pain she's referring to is  actually muscular- the R upper trapezius pain.  Suggest using -/hug big pillow.to take weight off R shoulder and muscles.    3.  Cannot have surgery for 3 months after oral prednisone. I suggest not having surgery unless have someone who can help recover.   4. Prednisone- 60 mg daily x 3 days, then 40 mg daily x 3 days; 20 mg x 3 days; then 10 mg daily x 3 days.  Cannot do again for another 5-6 months.   5. Change Norco to 7/5/325 mg up to 3x/day- will increase to 3x/day as needed. And if doesn't need it- then don't take 3rd dose. Average 3x/day as needed.    6.  F/U in 3 months

## 2021-07-04 ENCOUNTER — Other Ambulatory Visit (HOSPITAL_COMMUNITY): Payer: Self-pay

## 2021-07-05 ENCOUNTER — Other Ambulatory Visit (HOSPITAL_COMMUNITY): Payer: Self-pay

## 2021-07-30 ENCOUNTER — Other Ambulatory Visit: Payer: Self-pay | Admitting: Physical Medicine and Rehabilitation

## 2021-07-31 ENCOUNTER — Other Ambulatory Visit (HOSPITAL_COMMUNITY): Payer: Self-pay

## 2021-07-31 MED ORDER — HYDROCODONE-ACETAMINOPHEN 7.5-325 MG PO TABS
1.0000 | ORAL_TABLET | Freq: Three times a day (TID) | ORAL | 0 refills | Status: DC | PRN
  Filled 2021-07-31 – 2021-08-03 (×2): qty 90, 30d supply, fill #0

## 2021-08-03 ENCOUNTER — Other Ambulatory Visit (HOSPITAL_COMMUNITY): Payer: Self-pay

## 2021-08-30 ENCOUNTER — Other Ambulatory Visit: Payer: Self-pay | Admitting: Physical Medicine and Rehabilitation

## 2021-08-31 ENCOUNTER — Other Ambulatory Visit (HOSPITAL_COMMUNITY): Payer: Self-pay

## 2021-08-31 MED ORDER — HYDROCODONE-ACETAMINOPHEN 7.5-325 MG PO TABS
1.0000 | ORAL_TABLET | Freq: Three times a day (TID) | ORAL | 0 refills | Status: DC | PRN
  Filled 2021-08-31: qty 90, 30d supply, fill #0

## 2021-09-03 ENCOUNTER — Other Ambulatory Visit (HOSPITAL_COMMUNITY): Payer: Self-pay

## 2021-09-24 ENCOUNTER — Other Ambulatory Visit: Payer: Self-pay | Admitting: Physical Medicine and Rehabilitation

## 2021-09-24 MED ORDER — HYDROCODONE-ACETAMINOPHEN 7.5-325 MG PO TABS
1.0000 | ORAL_TABLET | Freq: Three times a day (TID) | ORAL | 0 refills | Status: DC | PRN
  Filled 2021-09-28: qty 90, 30d supply, fill #0
  Filled ????-??-??: fill #0

## 2021-09-28 ENCOUNTER — Other Ambulatory Visit (HOSPITAL_COMMUNITY): Payer: Self-pay

## 2021-10-08 ENCOUNTER — Other Ambulatory Visit (HOSPITAL_COMMUNITY): Payer: Self-pay

## 2021-10-08 ENCOUNTER — Encounter: Payer: Self-pay | Admitting: Physical Medicine and Rehabilitation

## 2021-10-08 ENCOUNTER — Encounter
Payer: Medicare Other | Attending: Physical Medicine and Rehabilitation | Admitting: Physical Medicine and Rehabilitation

## 2021-10-08 VITALS — BP 148/79 | HR 96 | Ht 63.0 in | Wt 164.0 lb

## 2021-10-08 DIAGNOSIS — M48061 Spinal stenosis, lumbar region without neurogenic claudication: Secondary | ICD-10-CM | POA: Diagnosis present

## 2021-10-08 DIAGNOSIS — M7541 Impingement syndrome of right shoulder: Secondary | ICD-10-CM | POA: Insufficient documentation

## 2021-10-08 DIAGNOSIS — G894 Chronic pain syndrome: Secondary | ICD-10-CM | POA: Insufficient documentation

## 2021-10-08 DIAGNOSIS — G5701 Lesion of sciatic nerve, right lower limb: Secondary | ICD-10-CM | POA: Insufficient documentation

## 2021-10-08 DIAGNOSIS — M7918 Myalgia, other site: Secondary | ICD-10-CM | POA: Insufficient documentation

## 2021-10-08 MED ORDER — PREGABALIN 50 MG PO CAPS: 50.0000 mg | ORAL_CAPSULE | Freq: Two times a day (BID) | ORAL | 5 refills | Status: DC

## 2021-10-08 MED ORDER — HYDROCODONE-ACETAMINOPHEN 7.5-325 MG PO TABS
1.0000 | ORAL_TABLET | Freq: Three times a day (TID) | ORAL | 0 refills | Status: DC | PRN
  Filled 2021-10-08 – 2021-10-29 (×2): qty 90, 30d supply, fill #0

## 2021-10-08 NOTE — Progress Notes (Signed)
Subjective:    Patient ID: Karla Pineda, female    DOB: [birth year], 76 y.o.   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  Here for f/u of chronic pain Also has disc collapse at C4/5 based on xray from 2020. Marland Kitchen   Steroids lasted from February to end of April- started have more pain at beginning of May.   Hurting in shoulders, neck and arms.  Also fell 2 more times since last seen her.  1- pulling weeds and  backed up and fell into concrete block- so just accidental.  2. Small dog tripped her again and ran in front of her-  No fractures- just hurt for 2-3 days.  R knee still gives way sometimes like might buckle.  Using walking stick  when walks on uneven ground.   Still has pain in sciatica. But with increase meds, things going better than was.  Can do more housework and go do more places and do more things.    However also having RUE being tingling- numb frequently- but gets better with ROM- comes and goes "whenever it wants to".   Uses ice and heating pad along with meds- so can go about her day.   Sleeps hugging pillow- ike we discussed.   Pain Inventory Average Pain 8 Pain Right Now 5 My pain is burning, tingling, and aching  In the last 24 hours, has pain interfered with the following? General activity 8 Relation with others 4 Enjoyment of life 4 What TIME of day is your pain at its worst? morning , daytime, evening, and night Sleep (in general) Poor  Pain is worse with: walking, bending, sitting, inactivity, and standing Pain improves with: rest, heat/ice, therapy/exercise, medication, and injections Relief from Meds: 7  History reviewed. No pertinent family history. Social History   Socioeconomic History   Marital status: Widowed    Spouse name: Not on file   Number of children: Not on file   Years of education: Not on file    Highest education level: Not on file  Occupational History   Not on file  Tobacco Use   Smoking status: Former   Smokeless tobacco: Never  Vaping Use   Vaping Use: Never used  Substance and Sexual Activity   Alcohol use: Not Currently   Drug use: Never   Sexual activity: Not on file  Other Topics Concern   Not on file  Social History Narrative   Not on file   Social Determinants of Health   Financial Resource Strain: Not on file  Food Insecurity: Not on file  Transportation Needs: Not on file  Physical Activity: Not on file  Stress: Not on file  Social Connections: Not on file   Past Surgical History:  Procedure Laterality Date   ABDOMINAL HYSTERECTOMY     BACK SURGERY     NASAL SINUS SURGERY     Past Surgical History:  Procedure Laterality Date   ABDOMINAL HYSTERECTOMY     BACK SURGERY     NASAL SINUS SURGERY     Past Medical History:  Diagnosis Date   Hypertension    Thyroid disease    BP (!) 148/79   Pulse 96   Ht 5\' 3"  (1.6 m)   Wt 164 lb (74.4 kg)   SpO2 96%   BMI 29.05 kg/m   Opioid  Risk Score:   Fall Risk Score:  `1  Depression screen [facility] 2/9     10/08/2021    9:27 AM 03/26/2021    9:10 AM 12/29/2020    9:42 AM 10/20/2020    9:39 AM  Depression screen PHQ 2/9  Decreased Interest 0 1 0 0  Down, Depressed, Hopeless 0 1 0 1  PHQ - 2 Score 0 2 0 1  Altered sleeping    3  Tired, decreased energy    3  Change in appetite    0  Feeling bad or failure about yourself     0  Trouble concentrating    0  Moving slowly or fidgety/restless    0  Suicidal thoughts    0  PHQ-9 Score    7     Review of Systems  Constitutional: Negative.   HENT: Negative.    Eyes: Negative.   Respiratory: Negative.    Cardiovascular: Negative.   Endocrine: Negative.   Genitourinary: Negative.   Musculoskeletal:  Positive for back pain, gait problem and neck pain.  Skin: Negative.   Allergic/Immunologic: Negative.   Hematological: Negative.    Psychiatric/Behavioral:  Positive for sleep disturbance.       Objective:   Physical Exam  Awake, alert, appropriate, moving gingerly; accompanied by granddaughter Karla Pineda, NAD Cannot lift R shoulder greater than 80 degrees  without severe pain-  TTP with trigger points in R upper traps, levators and less so in R scalenes- likely secondary to R shoulder pain.       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  Here for f/u of chronic pain Also has disc collapse at C4/5 based on xray from 2020. Marland Kitchen   Cannot prescribe more steroids right now- however pt wishes we could. Will have wait til August/September.   2.  Has had trigger point injections, but only lasted ~ 2 weeks. Doesn't want to do again.    3. Muscle relaxant that made her too sleepy and so did Duloxetine- also tried gabapentin- didn't help pain- didn't want to take something that didn't help. Gabapentin made her feel floaty-   4. Magnesium- 400 mg 1-2 tabs/day- only side effect is looser stools- not good as needed- needs to take daily.   5. Continue to use theracane- 3-4 days/week- start with low pressure on R shoulder and neck like discussed, however ADD pressure over the 2-3 minutes - DON'T MASSAGE.  6. Con't Norco- for now- 7/5 mg 3x/day as needed- will send in another Rx- due to be refilled 10/26/21.   7. Lyrica/Pregabalin- 50 mg 2x/day x 1 week, then 1 tab in AM and 2 tabs at night- for nerve pain- usually fewer side effects than Gabapentin.   8. F/U in 3 months- and will do steroids at that time.  Call if any issues with Lyrica   I spent a total of  32  minutes on total care today- >50% coordination of care- due to education on trigger points, theracane and options for pain contorl.

## 2021-10-08 NOTE — Patient Instructions (Signed)
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  Here for f/u of chronic pain Also has disc collapse at C4/5 based on xray from 2020. Marland Kitchen   Cannot prescribe more steroids right now- however pt wishes we could. Will have wait til August/September.   2.  Has had trigger point injections, but only lasted ~ 2 weeks. Doesn't want to do again.    3. Muscle relaxant that made her too sleepy and so did Duloxetine- also tried gabapentin- didn't help pain- didn't want to take something that didn't help. Gabapentin made her feel floaty-   4. Magnesium- 400 mg 1-2 tabs/day- only side effect is looser stools- not good as needed- needs to take daily.   5. Continue to use theracane- 3-4 days/week- start with low pressure on R shoulder and neck like discussed, however ADD pressure over the 2-3 minutes - DON'T MASSAGE.  6. Con't Norco- for now- 7/5 mg 3x/day as needed- will send in another Rx- due to be refilled 10/26/21.   7. Lyrica/Pregabalin- 50 mg 2x/day x 1 week, then 1 tab in AM and 2 tabs at night- for nerve pain- usually fewer side effects than Gabapentin.   8. F/U in 3 months- and will do steroids at that time.  Call if any issues with Lyrica

## 2021-10-29 ENCOUNTER — Other Ambulatory Visit (HOSPITAL_COMMUNITY): Payer: Self-pay

## 2021-11-07 ENCOUNTER — Ambulatory Visit: Payer: Medicare Other | Admitting: Orthopaedic Surgery

## 2021-11-07 ENCOUNTER — Other Ambulatory Visit: Payer: Self-pay

## 2021-11-07 VITALS — BP 172/69 | HR 62

## 2021-11-07 DIAGNOSIS — M7541 Impingement syndrome of right shoulder: Secondary | ICD-10-CM

## 2021-11-08 NOTE — Progress Notes (Signed)
Office Visit Note   Patient: Karla Pineda           Date of Birth: 08-18-1945           MRN: [id number] Visit Date: 11/07/2021              Requested by: Lonie Peak, PA-C [address] [place],  [place] [address] PCP: Lonie Peak, PA-C   Assessment & Plan: Visit Diagnoses:  1. Impingement syndrome of right shoulder     Plan: We will proceed with MRI scan right shoulder for persistent pain impingement weakness of the rotator cuff on testing and failed subacromial injection.  She has some decreased range of motion consistent with a component of mild adhesive capsulitis.  Follow-Up Instructions: No follow-ups on file.   Orders:  No orders of the defined types were placed in this encounter.  No orders of the defined types were placed in this encounter.     Procedures: No procedures performed   Clinical Data: No additional findings.   Subjective: Chief Complaint  Patient presents with   Right Shoulder - Pain    HPI 76 year old female returns with ongoing pain with shoulder range of motion pain with abduction.  Previous injection November without relief.  She was diagnosed with shingles 15 days ago rash is resolving but she still symptomatic.  She has been on Zovirax.  She also additionally has hypertension some reflux and has been on Lyrica and also some hydrocodone for shingles.  PDMP review shows she has been on hydrocodone 7.5/325 chronically 90 tablets monthly.  Review of Systems All other systems noncontributory to HPI.  Past history of DVT right leg.  Persistent shoulder impingement, right.  Objective: Vital Signs: BP (!) 172/69   Pulse 62   Physical Exam Constitutional:      Appearance: She is well-developed.  HENT:     Head: Normocephalic.     Right Ear: External ear normal.     Left Ear: External ear normal. There is no impacted cerumen.  Eyes:     Pupils: Pupils are equal, round, and reactive to light.  Neck:     Thyroid: No  thyromegaly.     Trachea: No tracheal deviation.  Cardiovascular:     Rate and Rhythm: Normal rate.  Pulmonary:     Effort: Pulmonary effort is normal.  Abdominal:     Palpations: Abdomen is soft.  Musculoskeletal:     Cervical back: No rigidity.  Skin:    General: Skin is warm and dry.  Neurological:     Mental Status: She is alert and oriented to person, place, and time.  Psychiatric:        Behavior: Behavior normal.     Ortho Exam rash anterior chest wall right side about T5.  Bilateral brachial plexus tenderness mild.  Positive drop arm test positive pinch at right shoulder. Limitation internal rotation right arm just posterior axillary line with her hand.  She lacks 20 degrees passive flexion and 25 degrees abduction passively.  Actively she can abduct to 100 degrees with positive drop arm test on the right negative on the left. Specialty Comments:  No specialty comments available.  Imaging: No results found.   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  Past Surgical History:  Procedure Laterality Date   ABDOMINAL HYSTERECTOMY     BACK SURGERY     NASAL SINUS SURGERY     Social History   Occupational History   Not on file  Tobacco Use   Smoking status: Former   Smokeless tobacco: Never  Vaping Use   Vaping Use: Never used  Substance and Sexual Activity   Alcohol use: Not Currently   Drug use: Never   Sexual activity: Not on  file

## 2021-11-09 ENCOUNTER — Telehealth: Payer: Self-pay | Admitting: Orthopaedic Surgery

## 2021-11-09 NOTE — Telephone Encounter (Signed)
Called pt 1X and left a vm for pt to call and set an MRI Review appt after 7/16

## 2021-11-13 IMAGING — MR MR LUMBAR SPINE WO/W CM
4 of 7 series · 24 of 48 positions shown · IV contrast (multihance)
Comparison: Lumbar radiographs 04/07/2019 lumbar MRI 11/04/2015.

CLINICAL DATA: 73-year-old female with progressive low back pain,
right lower extremity radiculopathy. Right anterior tibia and
gastrocnemius weakness.

Creatinine was obtained on site at [HOSPITAL] at [HOSPITAL].
Results: Creatinine 0.7 mg/dL.
EXAM:
MRI LUMBAR SPINE WITHOUT AND WITH CONTRAST
TECHNIQUE: Multiplanar and multiecho pulse sequences of the lumbar spine were
obtained without and with intravenous contrast.
CONTRAST:  15mL MULTIHANCE GADOBENATE DIMEGLUMINE 529 MG/ML IV SOLN

[Series 3: T1 · sagittal · 4.0mm · 0.55mm/px · 6 of 19 slices shown (1 of 2)]
[im 1/19]
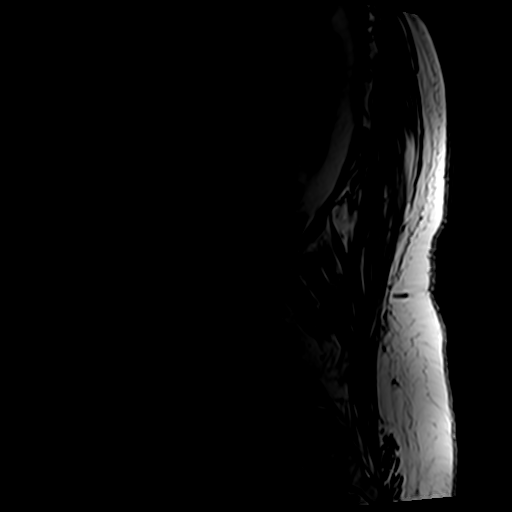
[im 4/19]
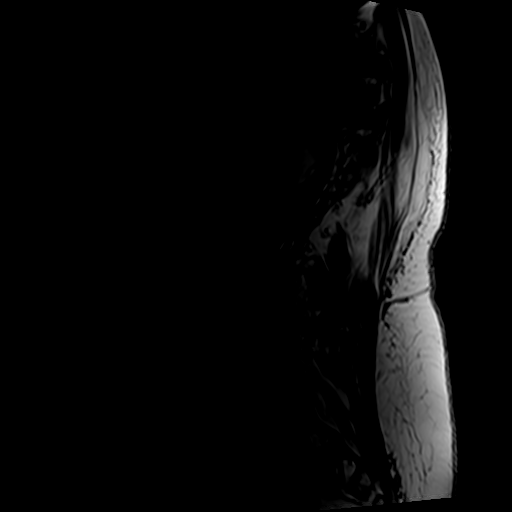
[im 8/19]
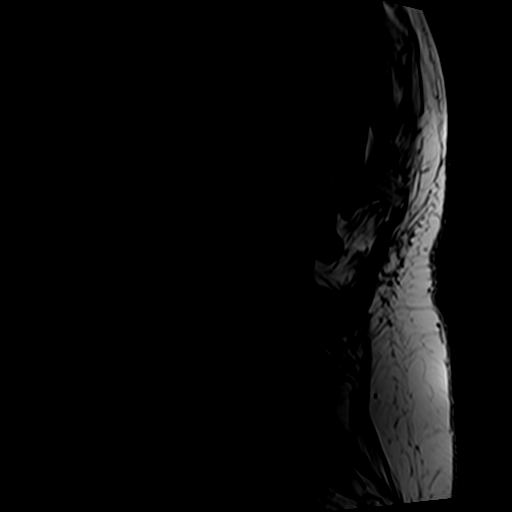
[im 11/19]
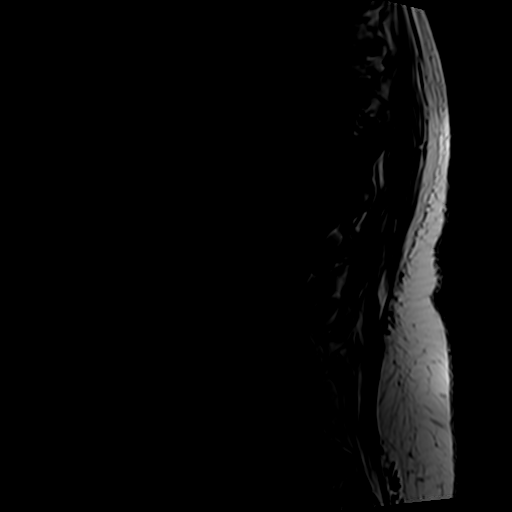
[im 15/19]
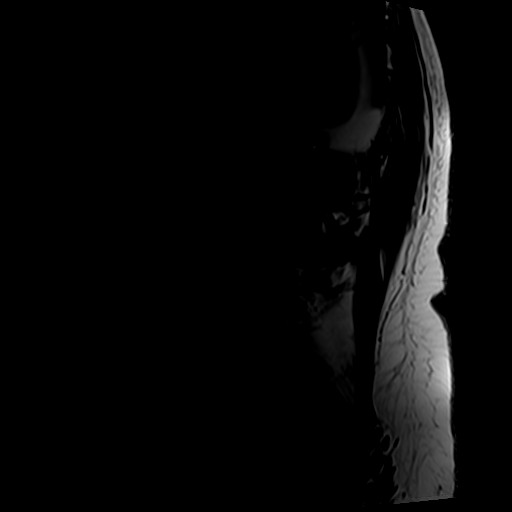
[im 19/19]
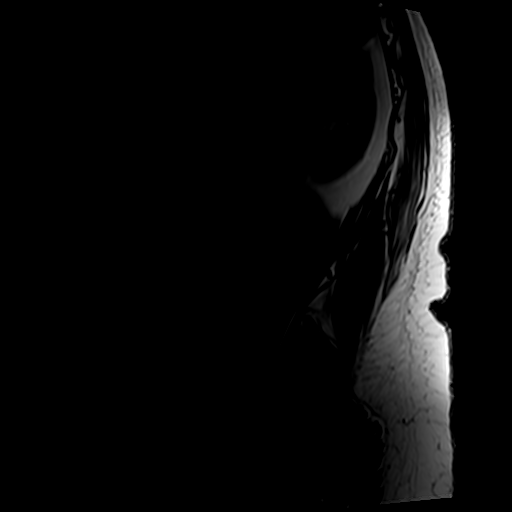

[Series 5: T2 · axial · 4.0mm · 0.70mm/px · z∈[-58,+156]mm · 9 of 36 slices shown (1 of 2)]
[im 1/36]
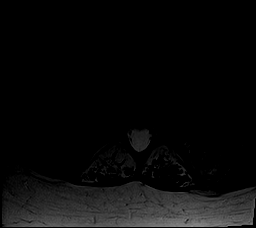
[im 5/36]
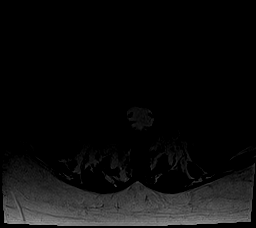
[im 9/36]
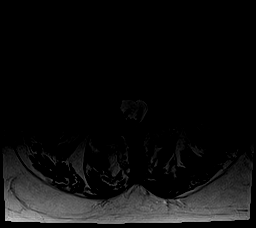
[im 14/36]
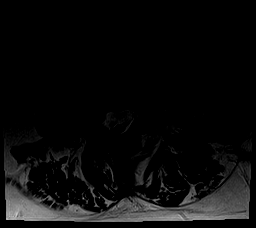
[im 18/36]
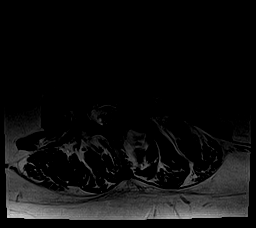
[im 22/36]
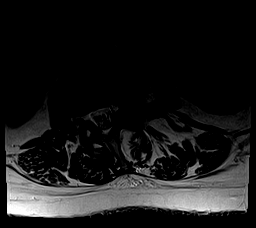
[im 27/36]
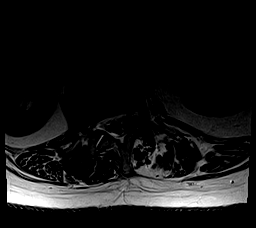
[im 31/36]
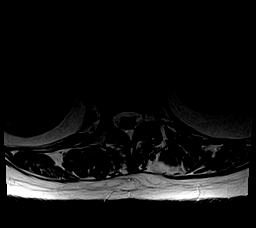
[im 36/36]
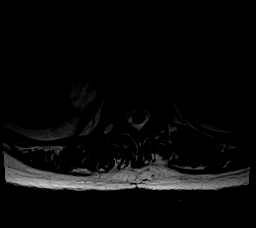

[Series 6: T1 · axial · 4.0mm · 0.35mm/px · z∈[-58,+131]mm · 4 of 36 slices shown (2 of 2)]
[im 1/36]
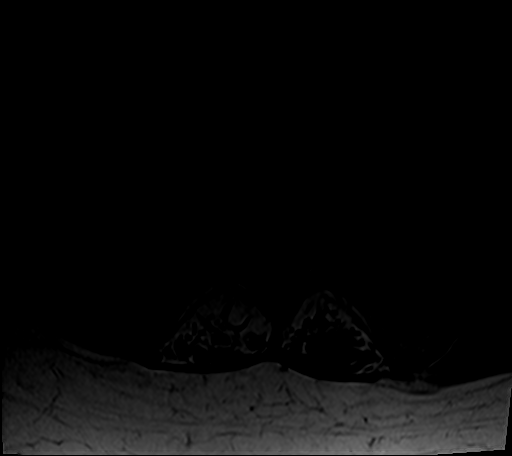
[im 5/36]
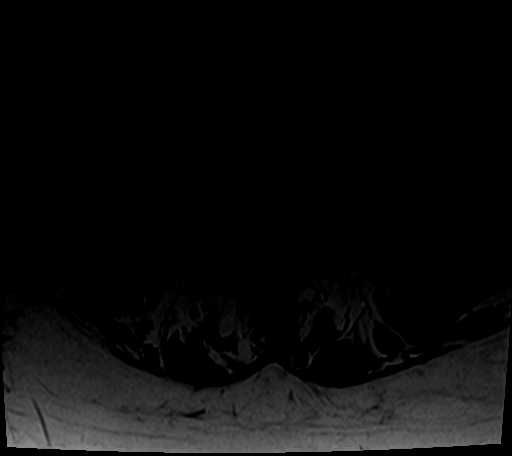
[im 18/36]
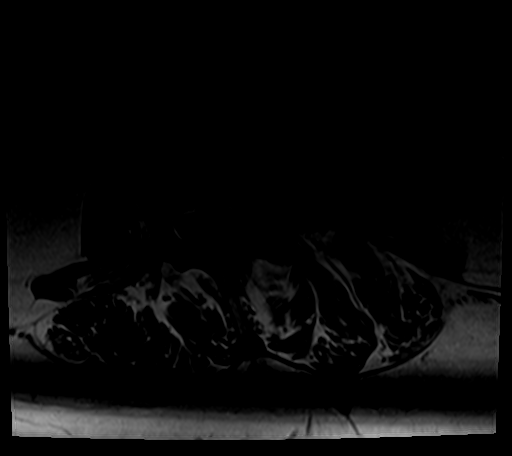
[im 31/36]
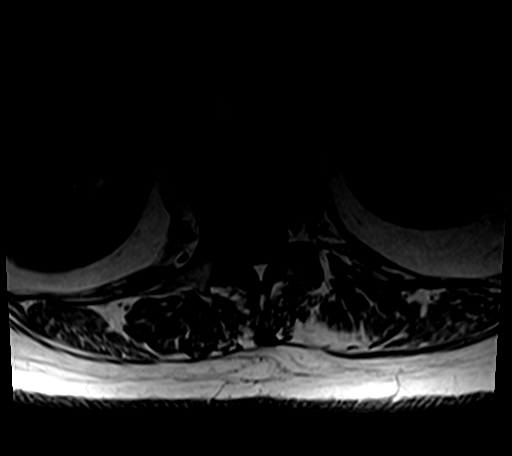

[Series 7: T2 · sagittal · 4.0mm · 0.55mm/px · 5 of 19 slices shown (2 of 2)]
[im 1/19]
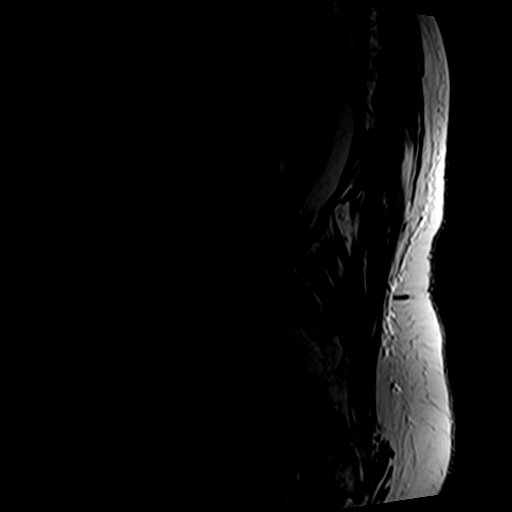
[im 5/19]
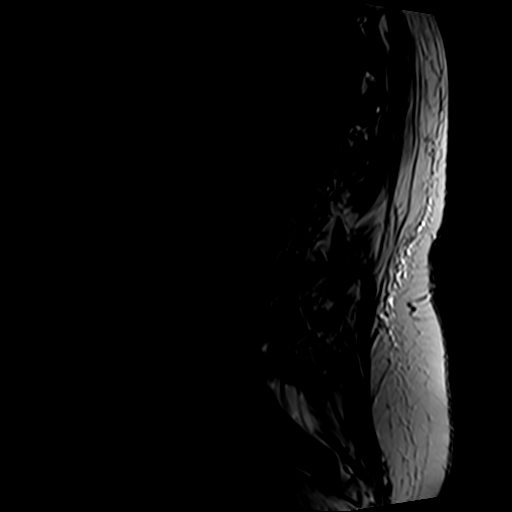
[im 10/19]
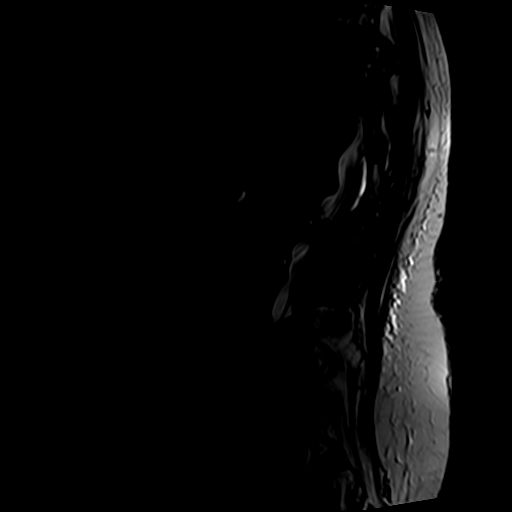
[im 14/19]
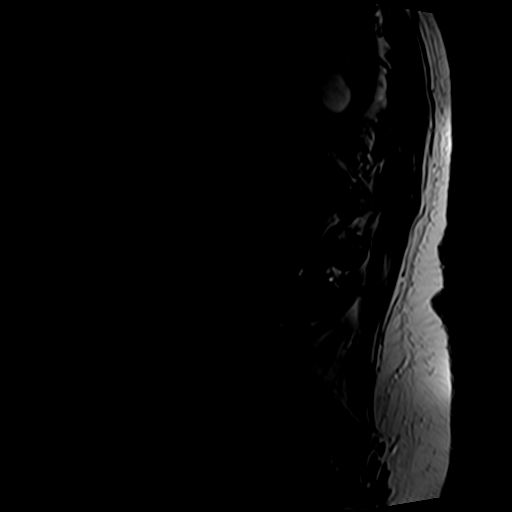
[im 19/19]
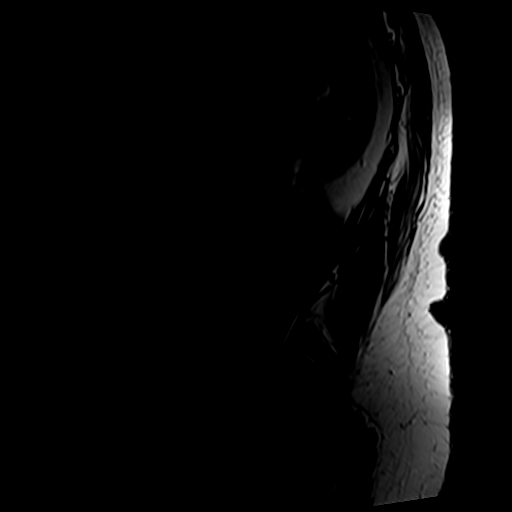

[24 of 48 positions shown; findings below may reference images not displayed]

FINDINGS: Segmentation:  Normal on the comparison radiographs.

Alignment: Moderate to severe dextroconvex lumbar scoliosis
redemonstrated. Associated mild chronic straightening of lumbar
lordosis. Superimposed mild grade 1 anterolisthesis in the lower
thoracic spine at T10-T11.

Vertebrae: Degenerative endplate marrow edema and enhancement
laterally on the left at L1-L2, L3-L4. Underlying chronic
degenerative endplate marrow signal changes. Background bone marrow
signal is within normal limits. Intact visible sacrum and SI joints.

Conus medullaris and cauda equina: Conus extends to the L1 level. No
lower spinal cord or conus signal abnormality. No abnormal
intradural enhancement. No dural thickening.

Paraspinal and other soft tissues: Negative visible abdominal
viscera; benign right renal cysts. Negative visualized posterior
paraspinal soft tissues.

Disc levels:

T10-T11: Grade 1 anterolisthesis with severe disc space loss, disc
bulge, and posterior element hypertrophy. Mild spinal stenosis with
possible mild cord mass effect (series 7, image 13). Moderate to
severe right T10 foraminal stenosis.

T11-T12: Mild disc bulge. Right posterior element hypertrophy. Mild
to moderate right T11 foraminal stenosis.

T12-L1: Mild to moderate right facet hypertrophy. Borderline to mild
right T12 foraminal stenosis appears stable.

L1-L2: Chronic disc space loss and left eccentric circumferential
disc osteophyte complex. Moderate left facet hypertrophy. Mild left
lateral recess stenosis appears increased (left L2 nerve level).
Moderate to severe left L1 foraminal stenosis appears increased.

L2-L3: Severe disc space loss with left eccentric circumferential
disc osteophyte complex. Broad-based posterior component. Mild
mostly left side posterior element hypertrophy. Mild epidural
lipomatosis. Stable mild spinal stenosis. Moderate to severe left L2
foraminal stenosis is stable to increased.

L3-L4: Chronic circumferential disc osteophyte complex and up to
moderate posterior element hypertrophy. Increased mild spinal
stenosis. Stable moderate to severe left and mild right L3 foraminal
stenosis.

L4-L5: Chronic circumferential disc osteophyte complex and mild to
moderate posterior element hypertrophy. Moderate right lateral
recess stenosis appears increased. No significant spinal stenosis.
Moderate right L4 foraminal stenosis is stable.

L5-S1: Chronic right eccentric circumferential disc osteophyte
complex and moderate to severe right facet hypertrophy. Stable mild
right lateral recess stenosis without spinal stenosis. Stable severe
right L5 foraminal stenosis.
IMPRESSION: 1. Chronic moderate to severe dextroconvex lumbar scoliosis with
advanced lower thoracic and lumbar disc, endplate, and posterior
element degeneration. Occasional degenerative endplate marrow edema.
2. Mild lower thoracic spinal stenosis with possible mild cord mass
effect at T10-T11. No definite cord signal abnormality.
3. Progressed multifactorial stenosis at the L1-L2, and L2-L3 levels
since the 2529 MRI. Up to mild spinal stenosis, but moderate or
severe neural foraminal stenosis at the right T12, left L1, left L2,
left L3, right L4, and right L5 nerve levels.

## 2021-11-18 ENCOUNTER — Other Ambulatory Visit: Payer: Medicare Other

## 2021-11-18 ENCOUNTER — Ambulatory Visit
Admission: RE | Admit: 2021-11-18 | Discharge: 2021-11-18 | Disposition: A | Discharge status: Home / Self Care | Payer: Medicare Other | Source: Ambulatory Visit | Attending: Orthopaedic Surgery | Admitting: Orthopaedic Surgery

## 2021-11-18 DIAGNOSIS — M7541 Impingement syndrome of right shoulder: Secondary | ICD-10-CM

## 2021-11-20 ENCOUNTER — Ambulatory Visit: Payer: Medicare Other | Admitting: Orthopaedic Surgery

## 2021-11-21 ENCOUNTER — Other Ambulatory Visit: Payer: Self-pay | Admitting: Physical Medicine and Rehabilitation

## 2021-11-23 ENCOUNTER — Other Ambulatory Visit (HOSPITAL_COMMUNITY): Payer: Self-pay

## 2021-11-23 ENCOUNTER — Other Ambulatory Visit: Payer: Self-pay | Admitting: Physical Medicine and Rehabilitation

## 2021-11-23 ENCOUNTER — Telehealth: Payer: Self-pay

## 2021-11-23 MED ORDER — HYDROCODONE-ACETAMINOPHEN 7.5-325 MG PO TABS
1.0000 | ORAL_TABLET | Freq: Three times a day (TID) | ORAL | 0 refills | Status: DC | PRN
  Filled 2021-11-23 – 2021-11-27 (×2): qty 90, 30d supply, fill #0

## 2021-11-23 NOTE — Telephone Encounter (Signed)
Please fill or advise Dr. Berline Chough is out of the office. Thank you.  PMP REPORT:  Filled  Written  ID  Drug  QTY  Days  Prescriber  RX #  Dispenser  Refill  Daily Dose*  Pymt Type  PMP  10/29/2021 10/08/2021 2  Hydrocodone-Acetamin 7.5-325 90.00 30 Me Lov [id number] Mos (0906) 0/0 22.50 MMET Other [place]

## 2021-11-23 NOTE — Telephone Encounter (Signed)
Dr. Berline Chough is currently out of the office. Please refill or advise. Thank you.    Filled  Written  ID  Drug  QTY  Days  Prescriber  RX #  Dispenser  Refill  Daily Dose*  Pymt Type  PMP  10/29/2021 10/08/2021 2  Hydrocodone-Acetamin 7.5-325 90.00 30 Me Lov [id number] Mos (0906) 0/0 22.50 MMET Other [place]

## 2021-11-27 ENCOUNTER — Encounter: Payer: Self-pay | Admitting: Orthopaedic Surgery

## 2021-11-27 ENCOUNTER — Ambulatory Visit: Payer: Medicare Other | Admitting: Orthopaedic Surgery

## 2021-11-27 ENCOUNTER — Other Ambulatory Visit (HOSPITAL_COMMUNITY): Payer: Self-pay

## 2021-11-27 VITALS — BP 133/80 | HR 72 | Ht 63.0 in | Wt 164.0 lb

## 2021-11-27 DIAGNOSIS — M4722 Other spondylosis with radiculopathy, cervical region: Secondary | ICD-10-CM

## 2021-11-27 DIAGNOSIS — M7541 Impingement syndrome of right shoulder: Secondary | ICD-10-CM | POA: Diagnosis not present

## 2021-11-27 NOTE — Progress Notes (Unsigned)
Office Visit Note   Patient: Karla Pineda           Date of Birth: 08/05/1945           MRN: [id number] Visit Date: 11/27/2021              Requested by: Lonie Peak, PA-C [address] [place],  [place] [address] PCP: Lonie Peak, PA-C   Assessment & Plan: Visit Diagnoses:  1. Impingement syndrome of right shoulder   2. Other spondylosis with radiculopathy, cervical region     Plan: Subacromial injection performed.  We will check her in follow-up if she is having ongoing problems we discussed operative intervention as an option with likely biceps tenodesis versus release and rotator cuff repair particular the supraspinatus if needed.  We discussed that if the rotator cuff supraspinatus was repaired then the biceps could be addressed through the same small incision as well as arthroscopy.  Follow-Up Instructions: Return if symptoms worsen or fail to improve.   Orders:  Orders Placed This Encounter  Procedures   Large Joint Inj: R subacromial bursa   No orders of the defined types were placed in this encounter.     Procedures: Large Joint Inj: R subacromial bursa on 11/27/2021 2:17 PM Indications: pain Details: 22 G 1.5 in needle, lateral approach  Arthrogram: No  Medications: 4 mL bupivacaine 0.25 %; 40 mg methylPREDNISolone acetate 40 MG/ML; 0.5 mL lidocaine 1 % Outcome: tolerated well, no immediate complications Procedure, treatment alternatives, risks and benefits explained, specific risks discussed. Consent was given by the patient. Immediately prior to procedure a time out was called to verify the correct patient, procedure, equipment, support staff and site/side marked as required. Patient was prepped and draped in the usual sterile fashion.       Clinical Data: No additional findings.   Subjective: Chief Complaint  Patient presents with   Right Shoulder - Pain    HPI 76 year old female with ongoing problems with right shoulder pain.  Pain  with outstretched reaching.  She has had problems with shingles which is increased the pain.  She is use lidocaine patches also gabapentin, hydrocodone.  Pain with abduction and internal rotation of her shoulder.  MRI scan is reviewed which shows prominent supraspinatus, moderate infraspinatus and subscapularis tendinopathy.  Pronounced biceps tendinopathy with partial tearing of the long head which is most likely her most significant problem based on exam.  She has moderate degenerative AC arthritis problems and has pain with attempts at crossarm position.  No definite full-thickness tear is present.    Review of Systems 14 point system update unchanged.   Objective: Vital Signs: BP 133/80   Pulse 72   Ht 5\' 3"  (1.6 m)   Wt 164 lb (74.4 kg)   BMI 29.05 kg/m   Physical Exam Constitutional:      Appearance: She is well-developed.  HENT:     Head: Normocephalic.     Right Ear: External ear normal.     Left Ear: External ear normal. There is no impacted cerumen.  Eyes:     Pupils: Pupils are equal, round, and reactive to light.  Neck:     Thyroid: No thyromegaly.     Trachea: No tracheal deviation.  Cardiovascular:     Rate and Rhythm: Normal rate.  Pulmonary:     Effort: Pulmonary effort is normal.  Abdominal:     Palpations: Abdomen is soft.  Musculoskeletal:     Cervical back: No rigidity.  Skin:    General: Skin is warm and dry.  Neurological:     Mental Status: She is alert and oriented to person, place, and time.  Psychiatric:        Behavior: Behavior normal.     Ortho Exam positive impingement right shoulder negative brachial plexus tenderness negative [place].  Positive crossarm test.  Pain with resisted supraspinatus testing.  Long head of the biceps anteriorly is significantly tender.  Specialty Comments:  No specialty comments available.  Imaging: CLINICAL DATA:  Right shoulder pain radiating down the right arm into the fingers. Numbness in the fingers.  Right arm weakness.   EXAM: MRI OF THE RIGHT SHOULDER WITHOUT CONTRAST   TECHNIQUE: Multiplanar, multisequence MR imaging of the shoulder was performed. No intravenous contrast was administered.   COMPARISON:  Radiographs 03/07/2021   FINDINGS: Rotator cuff: Prominent supraspinatus tendinopathy with moderate infraspinatus and subscapularis tendinopathy   Muscles:  Unremarkable   Biceps long head: Prominent biceps tendinopathy with partial tearing.   Acromioclavicular Joint: Moderate spurring and mild subcortical marrow edema with a small amount of fluid signal in the joint. Type II acromion. Subacromial subdeltoid bursitis is present along with mild subcoracoid bursitis.   Glenohumeral Joint: Mild chondral thinning. Mild spurring of the humeral head. Upper normal amount of fluid in the glenohumeral joint. Synovitis in the rotator interval but not along the axillary pouch.   Labrum: Degenerative signal in the posterosuperior labrum but without a well-defined labral tear.   Bones: Degenerative subcortical cystic lesions along the humeral head and greater tuberosity.   Other: No supplemental non-categorized findings.   IMPRESSION: 1. Prominent supraspinatus with moderate infraspinatus and subscapularis tendinopathy. 2. Prominent biceps tendinopathy with partial tearing of the long head. 3. Moderate degenerative AC joint arthropathy and mild degenerative chondral thinning in the glenohumeral joint. 4. Synovitis in the rotator interval. 5. Subacromial subdeltoid bursitis and mild subcoracoid bursitis.     Electronically Signed   By: Gaylyn Rong M.D.   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 No pertinent family history.  Past Surgical History:  Procedure Laterality Date   ABDOMINAL HYSTERECTOMY     BACK SURGERY     NASAL SINUS SURGERY     Social History   Occupational History   Not on file  Tobacco Use   Smoking status: Former   Smokeless tobacco: Never  Vaping Use   Vaping Use: Never used  Substance and Sexual Activity   Alcohol use: Not Currently   Drug use: Never   Sexual activity: Not on file

## 2021-11-28 MED ORDER — BUPIVACAINE HCL 0.25 % IJ SOLN
4.0000 mL | INTRAMUSCULAR | Status: AC | PRN
  Administered 2021-11-27: 4 mL via INTRA_ARTICULAR

## 2021-11-28 MED ORDER — LIDOCAINE HCL 1 % IJ SOLN
0.5000 mL | INTRAMUSCULAR | Status: AC | PRN
  Administered 2021-11-27: .5 mL

## 2021-11-28 MED ORDER — METHYLPREDNISOLONE ACETATE 40 MG/ML IJ SUSP
40.0000 mg | INTRAMUSCULAR | Status: AC | PRN
  Administered 2021-11-27: 40 mg via INTRA_ARTICULAR

## 2021-12-21 ENCOUNTER — Other Ambulatory Visit (HOSPITAL_COMMUNITY): Payer: Self-pay

## 2021-12-24 ENCOUNTER — Other Ambulatory Visit: Payer: Self-pay | Admitting: Physical Medicine & Rehabilitation

## 2021-12-24 ENCOUNTER — Other Ambulatory Visit (HOSPITAL_COMMUNITY): Payer: Self-pay

## 2021-12-24 MED ORDER — HYDROCODONE-ACETAMINOPHEN 7.5-325 MG PO TABS
1.0000 | ORAL_TABLET | Freq: Three times a day (TID) | ORAL | 0 refills | Status: DC | PRN
  Filled 2021-12-24 – 2021-12-26 (×3): qty 90, 30d supply, fill #0

## 2021-12-25 ENCOUNTER — Other Ambulatory Visit (HOSPITAL_COMMUNITY): Payer: Self-pay

## 2021-12-26 ENCOUNTER — Other Ambulatory Visit (HOSPITAL_COMMUNITY): Payer: Self-pay

## 2022-01-22 ENCOUNTER — Other Ambulatory Visit: Payer: Self-pay | Admitting: Physical Medicine and Rehabilitation

## 2022-01-22 MED ORDER — HYDROCODONE-ACETAMINOPHEN 7.5-325 MG PO TABS
1.0000 | ORAL_TABLET | Freq: Three times a day (TID) | ORAL | 0 refills | Status: DC | PRN
  Filled 2022-01-24: qty 90, 30d supply, fill #0

## 2022-01-22 NOTE — Telephone Encounter (Signed)
Can you please send in Dr. Florentina Jenny absence

## 2022-01-23 ENCOUNTER — Other Ambulatory Visit (HOSPITAL_COMMUNITY): Payer: Self-pay

## 2022-01-24 ENCOUNTER — Other Ambulatory Visit (HOSPITAL_COMMUNITY): Payer: Self-pay

## 2022-01-30 ENCOUNTER — Other Ambulatory Visit: Payer: Self-pay | Admitting: Physician Assistant

## 2022-01-30 DIAGNOSIS — M8580 Other specified disorders of bone density and structure, unspecified site: Secondary | ICD-10-CM

## 2022-01-30 DIAGNOSIS — Z1231 Encounter for screening mammogram for malignant neoplasm of breast: Secondary | ICD-10-CM

## 2022-02-04 ENCOUNTER — Encounter
Payer: Medicare Other | Attending: Physical Medicine and Rehabilitation | Admitting: Physical Medicine and Rehabilitation

## 2022-02-04 ENCOUNTER — Encounter: Payer: Self-pay | Admitting: Physical Medicine and Rehabilitation

## 2022-02-04 VITALS — BP 153/76 | HR 56 | Ht 63.0 in | Wt 168.0 lb

## 2022-02-04 DIAGNOSIS — G894 Chronic pain syndrome: Secondary | ICD-10-CM | POA: Diagnosis present

## 2022-02-04 DIAGNOSIS — G8929 Other chronic pain: Secondary | ICD-10-CM | POA: Diagnosis present

## 2022-02-04 DIAGNOSIS — B0229 Other postherpetic nervous system involvement: Secondary | ICD-10-CM | POA: Diagnosis present

## 2022-02-04 DIAGNOSIS — Z5181 Encounter for therapeutic drug level monitoring: Secondary | ICD-10-CM | POA: Insufficient documentation

## 2022-02-04 DIAGNOSIS — M5442 Lumbago with sciatica, left side: Secondary | ICD-10-CM | POA: Diagnosis present

## 2022-02-04 DIAGNOSIS — Z79891 Long term (current) use of opiate analgesic: Secondary | ICD-10-CM | POA: Insufficient documentation

## 2022-02-04 DIAGNOSIS — M5441 Lumbago with sciatica, right side: Secondary | ICD-10-CM | POA: Diagnosis present

## 2022-02-04 MED ORDER — LIDOCAINE 5 % EX PTCH
3.0000 | MEDICATED_PATCH | CUTANEOUS | 5 refills | Status: DC
Start: 2022-02-04 — End: 2022-11-08

## 2022-02-04 MED ORDER — LIDOCAINE 5 % EX OINT: 1.0000 | TOPICAL_OINTMENT | Freq: Four times a day (QID) | CUTANEOUS | 5 refills | Status: DC | PRN

## 2022-02-04 MED ORDER — HYDROCODONE-ACETAMINOPHEN 7.5-325 MG PO TABS: 1.0000 | ORAL_TABLET | Freq: Three times a day (TID) | ORAL | 0 refills | Status: DC | PRN

## 2022-02-04 NOTE — Progress Notes (Signed)
Subjective:    Patient ID: Karla Pineda, female    DOB: 1945-08-14, 76 y.o.   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  Here for f/u of chronic pain Also has disc collapse at C4/5 based on xray from 2020.   Lyrica- stopped it- made her felt sleepy and "bad in her head". Also had dizziness- took 8-9 days.    Had a really bad case of shingles.  Breasts, chest, upper abdomen and R posterior shoulder- still has healing sores.   Went to Dr Ophelia Charter again- got MRI - shot given for supraspinatus/infraspinatus and subscapularis- moderate AC joint OA; synovitis of RTC- and bursitis- R biceps tendinopathy with partial tear of long head. subacromial/subcoracoid- shot helped R shoulder for 2 months- wearing off now.  But didn't help R triceps pain- which is really sore to touch. Uses Biofreeze for that.    Still having the back pain- pressure when stands to wash dishes.  Wore her out shopping- getting in and out of car- has to push buggy in the store to help.   Still tries to walk around land- tries to keep moving. Has benches on land spaced to can sit down if needed.    Pain Inventory Average Pain 7 Pain Right Now 7 My pain is intermittent, sharp, burning, dull, stabbing, tingling, and aching  In the last 24 hours, has pain interfered with the following? General activity 4 Relation with others 0 Enjoyment of life 4 What TIME of day is your pain at its worst? morning  and night Sleep (in general) Fair  Pain is worse with: walking, bending, sitting, standing, and some activites Pain improves with: rest, heat/ice, and medication Relief from Meds: 5  History reviewed. No pertinent family history. Social History   Socioeconomic History   Marital status: Widowed    Spouse name: Not on file   Number of children: Not on file   Years of education: Not on  file   Highest education level: Not on file  Occupational History   Not on file  Tobacco Use   Smoking status: Former   Smokeless tobacco: Never  Vaping Use   Vaping Use: Never used  Substance and Sexual Activity   Alcohol use: Not Currently   Drug use: Never   Sexual activity: Not on file  Other Topics Concern   Not on file  Social History Narrative   Not on file   Social Determinants of Health   Financial Resource Strain: Not on file  Food Insecurity: Not on file  Transportation Needs: Not on file  Physical Activity: Not on file  Stress: Not on file  Social Connections: Not on file   Past Surgical History:  Procedure Laterality Date   ABDOMINAL HYSTERECTOMY     BACK SURGERY     NASAL SINUS SURGERY     Past Surgical History:  Procedure Laterality Date   ABDOMINAL HYSTERECTOMY     BACK SURGERY     NASAL SINUS SURGERY     Past Medical History:  Diagnosis Date   Hypertension    Thyroid disease    BP (!) 153/76   Pulse (!) 56   Ht 5\' 3"  (1.6 m)   Wt 168 lb (76.2 kg)   SpO2 95%   BMI 29.76 kg/m   Opioid Risk Score:   Fall  Risk Score:  `1  Depression screen [facility] 2/9     02/04/2022    8:47 AM 10/08/2021    9:27 AM 03/26/2021    9:10 AM 12/29/2020    9:42 AM 10/20/2020    9:39 AM  Depression screen PHQ 2/9  Decreased Interest 0 0 1 0 0  Down, Depressed, Hopeless 0 0 1 0 1  PHQ - 2 Score 0 0 2 0 1  Altered sleeping     3  Tired, decreased energy     3  Change in appetite     0  Feeling bad or failure about yourself      0  Trouble concentrating     0  Moving slowly or fidgety/restless     0  Suicidal thoughts     0  PHQ-9 Score     7      Review of Systems  Musculoskeletal:  Positive for back pain.  All other systems reviewed and are negative.     Objective:   Physical Exam Awake, alert, appropriate, no assistive device, NAD Scabs on upper back but are crusted over/almost healed- gone on abdomen, R chest, breast and mid R arm Still TTP across  low back in band.       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  Here for f/u of chronic pain Also has disc collapse at C4/5 based on xray from 2020.  Also has severe R shoulder tendinopathy  Suggest either 4% lidocaine patches which are OTC- or Slather on the cream 5%  2. Will order Lidocaine 5% cream  for R upper arm up to 4x/day as needed- when not using patches  3.  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 Handicapped placard- gave ot pt for 5 years and needs renew  5. Suggest lumbar support- to wear no more than 4 hours/day.  Muscles can get weaker which will cause more pain.  If you wear more than 4hours/day.    6. Last refilled pain meds 9/20- will refill to get at pharmacy on time. Norco 7.5/325 mg 3x/day as needed #90   7. F/U in 3 months  8. UDS today  I spent a total of  31  minutes on total care today- >50% coordination of care- due to discussion of options for back pain as well as post herpatic neuralgia.

## 2022-02-04 NOTE — Patient Instructions (Signed)
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  Here for f/u of chronic pain Also has disc collapse at C4/5 based on xray from 2020.  Also has severe R shoulder tendinopathy   Suggest either 4% lidocaine patches which are OTC- or Slather on the cream 5%   2. Will order Lidocaine 5% cream  for R upper arm up to 4x/day as needed- when not using patches   3.  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 Handicapped placard- gave ot pt for 5 years and needs renew   5. Suggest lumbar support- to wear no more than 4 hours/day.  Muscles can get weaker which will cause more pain.  If you wear more than 4hours/day.      6. Last refilled pain meds 9/20- will refill to get at pharmacy on time. Norco 7.5/325 mg 3x/day as needed #90     7. F/U in 3 months   8. UDS today

## 2022-02-06 ENCOUNTER — Other Ambulatory Visit (HOSPITAL_COMMUNITY): Payer: Self-pay

## 2022-02-06 MED ORDER — HYDROCODONE-ACETAMINOPHEN 7.5-325 MG PO TABS
1.0000 | ORAL_TABLET | Freq: Three times a day (TID) | ORAL | 0 refills | Status: DC | PRN
  Filled 2022-02-06 – 2022-02-21 (×3): qty 90, 30d supply, fill #0

## 2022-02-06 NOTE — Telephone Encounter (Signed)
Please cancel [facility] norco - I alsready sent [facility] per pt request- ML

## 2022-02-07 LAB — TOXASSURE SELECT,+ANTIDEPR,UR

## 2022-02-20 ENCOUNTER — Other Ambulatory Visit (HOSPITAL_COMMUNITY): Payer: Self-pay

## 2022-02-21 ENCOUNTER — Other Ambulatory Visit (HOSPITAL_COMMUNITY): Payer: Self-pay

## 2022-02-22 ENCOUNTER — Other Ambulatory Visit (HOSPITAL_COMMUNITY): Payer: Self-pay

## 2022-03-05 ENCOUNTER — Encounter: Payer: Self-pay | Admitting: Specialist

## 2022-03-05 NOTE — Progress Notes (Signed)
Patient not seen rescheduled for appointment on 08/17/2019

## 2022-03-22 ENCOUNTER — Other Ambulatory Visit: Payer: Self-pay | Admitting: Physical Medicine and Rehabilitation

## 2022-03-25 ENCOUNTER — Telehealth: Payer: Self-pay

## 2022-03-25 ENCOUNTER — Encounter: Payer: Self-pay | Admitting: Physical Medicine and Rehabilitation

## 2022-03-25 ENCOUNTER — Other Ambulatory Visit (HOSPITAL_COMMUNITY): Payer: Self-pay

## 2022-03-25 MED ORDER — HYDROCODONE-ACETAMINOPHEN 7.5-325 MG PO TABS: 1.0000 | ORAL_TABLET | Freq: Three times a day (TID) | ORAL | 0 refills | Status: DC | PRN

## 2022-03-25 MED ORDER — HYDROCODONE-ACETAMINOPHEN 7.5-325 MG PO TABS
1.0000 | ORAL_TABLET | Freq: Three times a day (TID) | ORAL | 0 refills | Status: DC | PRN
  Filled 2022-03-25: qty 90, 30d supply, fill #0

## 2022-03-25 NOTE — Telephone Encounter (Signed)
Please resend the Hydrocodone 7.5-325 MG to the [facility]. Patient does not want the use the [facility].   (I have cancelled the [facility] script). Thank you.

## 2022-03-25 NOTE — Telephone Encounter (Signed)
PLEASE SEND TO [facility]  Filled  Written  ID  Drug  QTY  Days  Prescriber  RX #  Dispenser  Refill  Daily Dose*  Pymt Type  PMP  02/21/2022 02/06/2022 2  Hydrocodone-Acetamin 7.5-325 90.00 30 Me Lov [id number] Con (0906) 0/0 22.50 MME Other [place] 01/24/2022 01/22/2022 2  Hydrocodone-Acetamin 7.5-325 90.00 30 Yu Sht [id number] Con (0906) 0/0 22.50 MME [place]

## 2022-03-25 NOTE — Addendum Note (Signed)
Addended by: Genice Rouge on: 03/25/2022 04:07 PM   Modules accepted: Orders

## 2022-03-26 ENCOUNTER — Other Ambulatory Visit (HOSPITAL_BASED_OUTPATIENT_CLINIC_OR_DEPARTMENT_OTHER): Payer: Self-pay

## 2022-04-19 ENCOUNTER — Other Ambulatory Visit: Payer: Self-pay | Admitting: Physical Medicine and Rehabilitation

## 2022-04-22 ENCOUNTER — Other Ambulatory Visit (HOSPITAL_COMMUNITY): Payer: Self-pay

## 2022-04-22 MED ORDER — HYDROCODONE-ACETAMINOPHEN 7.5-325 MG PO TABS
1.0000 | ORAL_TABLET | Freq: Three times a day (TID) | ORAL | 0 refills | Status: DC | PRN
  Filled 2022-04-22 – 2022-04-23 (×2): qty 90, 30d supply, fill #0

## 2022-04-22 NOTE — Telephone Encounter (Signed)
Pt requesting refill on Hydrocodone last fill per pmp 03/25/2022  Filled  Written  ID  Drug  QTY  Days  Prescriber  RX #  Dispenser  Refill  Daily Dose*  Pymt Type  PMP  03/25/2022 03/25/2022 2  Hydrocodone-Acetamin 7.5-325 90.00 30 Me Lov [id number] Con (0906) 0/0 22.50 MME Other [place]

## 2022-04-22 NOTE — Telephone Encounter (Signed)
PMP was Reviewed.  DrLovorn note was reviewed.  Hydrocodone e-scribed today.  

## 2022-04-23 ENCOUNTER — Other Ambulatory Visit (HOSPITAL_COMMUNITY): Payer: Self-pay

## 2022-04-23 ENCOUNTER — Other Ambulatory Visit: Payer: Self-pay

## 2022-05-06 ENCOUNTER — Telehealth: Payer: Self-pay | Admitting: *Deleted

## 2022-05-06 NOTE — Telephone Encounter (Signed)
Urine drug screen for this encounter is consistent for prescribed medication 

## 2022-05-10 ENCOUNTER — Encounter: Payer: Self-pay | Admitting: Physical Medicine and Rehabilitation

## 2022-05-10 ENCOUNTER — Other Ambulatory Visit (HOSPITAL_COMMUNITY): Payer: Self-pay

## 2022-05-10 ENCOUNTER — Encounter: Payer: Medicare HMO | Attending: Physical Medicine and Rehabilitation | Admitting: Physical Medicine and Rehabilitation

## 2022-05-10 VITALS — BP 136/81 | HR 72 | Ht 63.0 in | Wt 160.0 lb

## 2022-05-10 DIAGNOSIS — G8929 Other chronic pain: Secondary | ICD-10-CM | POA: Diagnosis present

## 2022-05-10 DIAGNOSIS — G894 Chronic pain syndrome: Secondary | ICD-10-CM | POA: Diagnosis not present

## 2022-05-10 DIAGNOSIS — G5701 Lesion of sciatic nerve, right lower limb: Secondary | ICD-10-CM

## 2022-05-10 DIAGNOSIS — S129XXS Fracture of neck, unspecified, sequela: Secondary | ICD-10-CM | POA: Diagnosis not present

## 2022-05-10 DIAGNOSIS — S129XXA Fracture of neck, unspecified, initial encounter: Secondary | ICD-10-CM | POA: Insufficient documentation

## 2022-05-10 DIAGNOSIS — M25511 Pain in right shoulder: Secondary | ICD-10-CM

## 2022-05-10 DIAGNOSIS — M5441 Lumbago with sciatica, right side: Secondary | ICD-10-CM | POA: Diagnosis present

## 2022-05-10 DIAGNOSIS — M5442 Lumbago with sciatica, left side: Secondary | ICD-10-CM | POA: Insufficient documentation

## 2022-05-10 MED ORDER — HYDROCODONE-ACETAMINOPHEN 7.5-325 MG PO TABS
1.0000 | ORAL_TABLET | Freq: Three times a day (TID) | ORAL | 0 refills | Status: DC | PRN
  Filled 2022-05-10: qty 90, 30d supply, fill #0
  Filled 2022-05-20: qty 21, 7d supply, fill #0
  Filled 2022-05-27: qty 69, 23d supply, fill #1

## 2022-05-10 MED ORDER — BACLOFEN 10 MG PO TABS
5.0000 mg | ORAL_TABLET | Freq: Three times a day (TID) | ORAL | 5 refills | Status: DC
  Filled 2022-05-10: qty 90, 30d supply, fill #0

## 2022-05-10 NOTE — Patient Instructions (Addendum)
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  Here for f/u of chronic pain Also has disc collapse at C4/5 based on xray from 2020.  Also has severe R shoulder tendinopathy  Con't Norco 7.5 mg - last refill 12/19- will refill for 05/22/22 - last UDS consistent with regimen  2. Try baclofen 5 mg 3x/day x 1 week, then can increase to 10 mg 3x/day- most common side effects sleepiness and constipation- if so, get an over the counter laxative.   3. Let me know how Baclofen works for you. Give it ~ 10-20 days and let me know.    4. Has failed Zanaflex, Flexeril and nerve pain meds- all make her sedated/too sleepy.    5. Con't Magnesium for muscle relaxation.   6. Suggest going back to see Dr Lorin Mercy- to get shoulder injection. For partial rotator cuff tear to complete tear.    7. F/U in 3 months-

## 2022-05-10 NOTE — Progress Notes (Signed)
Subjective:    Patient ID: Karla Pineda, female    DOB: 1945/06/17, 77 y.o.   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  Here for f/u of chronic pain Also has disc collapse at C4/5 based on xray from 2020.  Also has severe R shoulder tendinopathy   Things about the same. More trouble with R low back/paraspinals So stiff when gets up in AM- takes 3 hours until can get moving- does stretches, heat and cold.    Had R shoulder injection 6+ months ago- now "killing her"- now B/L, not just R shoulder.   LLE is starting to hurt- when gets up every AM- for 2 hours- hurts like walked 20 miles- puts ice, biofreeze, and gone after 2 hours.   When starts walking- sets off again around 3-4 pm after walks property.   Not a good sleeper- ever.  Takes a couple of magnesium pills daily- most days  Uses theracane when goes outside sometimes.    Pain Inventory Average Pain 7 Pain Right Now 7 My pain is sharp, burning, stabbing, tingling, and aching  In the last 24 hours, has pain interfered with the following? General activity 9 Relation with others 9 Enjoyment of life 9 What TIME of day is your pain at its worst? morning  and evening Sleep (in general) Poor  Pain is worse with: walking, bending, sitting, inactivity, standing, and some activites Pain improves with: rest, heat/ice, medication, and injections Relief from Meds: 7  History reviewed. No pertinent family history. Social History   Socioeconomic History   Marital status: Widowed    Spouse name: Not on file   Number of children: Not on file   Years of education: Not on file   Highest education level: Not on file  Occupational History   Not on file  Tobacco Use   Smoking status: Former   Smokeless tobacco: Never  Vaping Use   Vaping Use: Never used  Substance and Sexual Activity    Alcohol use: Not Currently   Drug use: Never   Sexual activity: Not on file  Other Topics Concern   Not on file  Social History Narrative   Not on file   Social Determinants of Health   Financial Resource Strain: Not on file  Food Insecurity: Not on file  Transportation Needs: Not on file  Physical Activity: Not on file  Stress: Not on file  Social Connections: Not on file   Past Surgical History:  Procedure Laterality Date   ABDOMINAL HYSTERECTOMY     BACK SURGERY     NASAL SINUS SURGERY     Past Surgical History:  Procedure Laterality Date   ABDOMINAL HYSTERECTOMY     BACK SURGERY     NASAL SINUS SURGERY     Past Medical History:  Diagnosis Date   Hypertension    Thyroid disease    Ht 5\' 3"  (1.6 m)   Wt 160 lb (72.6 kg)   BMI 28.34 kg/m   Opioid Risk Score:   Fall Risk Score:  `1  Depression screen [facility] 2/9     05/10/2022    9:15 AM 02/04/2022    8:47 AM 10/08/2021    9:27 AM 03/26/2021    9:10 AM 12/29/2020    9:42 AM 10/20/2020    9:39 AM  Depression screen PHQ  2/9  Decreased Interest 0 0 0 1 0 0  Down, Depressed, Hopeless 0 0 0 1 0 1  PHQ - 2 Score 0 0 0 2 0 1  Altered sleeping      3  Tired, decreased energy      3  Change in appetite      0  Feeling bad or failure about yourself       0  Trouble concentrating      0  Moving slowly or fidgety/restless      0  Suicidal thoughts      0  PHQ-9 Score      7      Review of Systems  Musculoskeletal:  Positive for back pain and neck pain.       B/L thigh pain  Right arm pain   All other systems reviewed and are negative.     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  Here for f/u of chronic pain Also has disc collapse at C4/5 based on xray from 2020.  Also has severe R shoulder tendinopathy  Con't Norco 7.5 mg - last refill 12/19- will refill for 05/22/22 - last UDS consistent with regimen  2. Try baclofen 5 mg 3x/day x 1 week, then can increase to 10 mg 3x/day- most common side effects sleepiness and constipation- if so, get an over the counter laxative.   3. Let me know how Baclofen works for you. Give it ~ 10-20 days and let me know.    4. Has failed Zanaflex, Flexeril and nerve pain meds- all make her sedated/too sleepy.    5. Con't Magnesium for muscle relaxation.   6. Suggest going back to see Dr Lorin Mercy- to get shoulder injection.    7. F/U in 3 months- for chronic pain   I spent a total of 22   minutes on total care today- >50% coordination of care- due to discussion about pain; possible spasticity and R shoulder pain.

## 2022-05-21 ENCOUNTER — Other Ambulatory Visit: Payer: Self-pay

## 2022-05-21 ENCOUNTER — Other Ambulatory Visit (HOSPITAL_COMMUNITY): Payer: Self-pay

## 2022-05-22 ENCOUNTER — Other Ambulatory Visit (HOSPITAL_COMMUNITY): Payer: Self-pay

## 2022-05-27 ENCOUNTER — Other Ambulatory Visit (HOSPITAL_COMMUNITY): Payer: Self-pay

## 2022-05-28 ENCOUNTER — Other Ambulatory Visit (HOSPITAL_COMMUNITY): Payer: Self-pay

## 2022-06-19 ENCOUNTER — Other Ambulatory Visit: Payer: Self-pay | Admitting: Physical Medicine and Rehabilitation

## 2022-06-20 ENCOUNTER — Other Ambulatory Visit (HOSPITAL_COMMUNITY): Payer: Self-pay

## 2022-06-20 MED ORDER — HYDROCODONE-ACETAMINOPHEN 7.5-325 MG PO TABS
1.0000 | ORAL_TABLET | Freq: Three times a day (TID) | ORAL | 0 refills | Status: DC | PRN
  Filled 2022-06-20: qty 90, 30d supply, fill #0

## 2022-06-28 ENCOUNTER — Encounter: Payer: Self-pay | Admitting: Gastroenterology

## 2022-07-12 ENCOUNTER — Ambulatory Visit
Admission: RE | Admit: 2022-07-12 | Discharge: 2022-07-12 | Disposition: A | Discharge status: Home / Self Care | Payer: Medicare HMO | Source: Ambulatory Visit | Attending: Physician Assistant | Admitting: Physician Assistant

## 2022-07-12 DIAGNOSIS — M8580 Other specified disorders of bone density and structure, unspecified site: Secondary | ICD-10-CM

## 2022-07-12 DIAGNOSIS — Z1231 Encounter for screening mammogram for malignant neoplasm of breast: Secondary | ICD-10-CM

## 2022-07-16 ENCOUNTER — Other Ambulatory Visit: Payer: Self-pay | Admitting: Physical Medicine and Rehabilitation

## 2022-07-17 ENCOUNTER — Other Ambulatory Visit (HOSPITAL_COMMUNITY): Payer: Self-pay

## 2022-07-17 MED ORDER — HYDROCODONE-ACETAMINOPHEN 7.5-325 MG PO TABS
1.0000 | ORAL_TABLET | Freq: Three times a day (TID) | ORAL | 0 refills | Status: DC | PRN
  Filled 2022-07-17 – 2022-07-22 (×2): qty 90, 30d supply, fill #0

## 2022-07-22 ENCOUNTER — Other Ambulatory Visit (HOSPITAL_COMMUNITY): Payer: Self-pay

## 2022-08-09 ENCOUNTER — Encounter: Payer: Medicare HMO | Attending: Physical Medicine and Rehabilitation | Admitting: Physical Medicine and Rehabilitation

## 2022-08-09 ENCOUNTER — Other Ambulatory Visit (HOSPITAL_COMMUNITY): Payer: Self-pay

## 2022-08-09 ENCOUNTER — Encounter: Payer: Self-pay | Admitting: Physical Medicine and Rehabilitation

## 2022-08-09 VITALS — BP 151/73 | HR 71 | Ht 63.0 in | Wt 164.0 lb

## 2022-08-09 DIAGNOSIS — Z5181 Encounter for therapeutic drug level monitoring: Secondary | ICD-10-CM | POA: Diagnosis not present

## 2022-08-09 DIAGNOSIS — Z79891 Long term (current) use of opiate analgesic: Secondary | ICD-10-CM | POA: Diagnosis not present

## 2022-08-09 DIAGNOSIS — G894 Chronic pain syndrome: Secondary | ICD-10-CM | POA: Diagnosis not present

## 2022-08-09 MED ORDER — PREDNISONE 20 MG PO TABS
20.0000 mg | ORAL_TABLET | Freq: Every day | ORAL | 0 refills | Status: DC
  Filled 2022-08-09: qty 4, 4d supply, fill #0

## 2022-08-09 MED ORDER — HYDROCODONE-ACETAMINOPHEN 7.5-325 MG PO TABS
1.0000 | ORAL_TABLET | Freq: Three times a day (TID) | ORAL | 0 refills | Status: DC | PRN
  Filled 2022-08-09 – 2022-08-19 (×2): qty 90, 30d supply, fill #0

## 2022-08-09 NOTE — Patient Instructions (Signed)
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  Here for f/u of chronic pain Also has disc collapse at C4/5 based on xray from 2020.  Also has severe R shoulder tendinopathy; has new Dx of Osteoporosis.     Use medical gloves that can be thrown away- so can use hand better to soak with lidocaine.   2. Talk with Gynecologist about hot flashes-estrogen can increase Cardiovascular risk- however talk over with Gyn to determine best treatment-   3. Black Cohosh- is over the counter- can try that first.    4. Has osteoporosis - discuss with PCP Anna Genre- about options- schedule appointment to discuss options- don't do over phone.    5.  Has no kidney function issues, per pt- but can only see labs in 05/2018-   6. Lets  try Steroids- for a few days to help inflammation of R hand- trigger fingers in all fingers- Prednisone 20 mg daily x 4 days- doesn't have DM-   7. Call Dr Ophelia Charter about R hand/trigger fingers AND R shoulder injections.    8. Came early and did UDS this AM- is due   9. Due for Norco 7.5/325 mg- soon- will refill since is due in 1 week or so. #90-   10. Will write written Rx for TLSO- corset brace- don't wear more than 4 hours/day. Wrote out Rx.   11. F/U in 3 months-  On chronic pain   12. Made Baclofen an allergy

## 2022-08-09 NOTE — Progress Notes (Signed)
Subjective:    Patient ID: Karla Pineda, female    DOB: 11/25/1945, 78 y.o.   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  Here for f/u of chronic pain Also has disc collapse at C4/5 based on xray from 2020.  Also has severe R shoulder tendinopathy    Couldn't take Baclofen- made her tired, weak; unbalanced- woke up at 3:15 am as well with terrible anxiety- took for 8 days.    Has trigger fingers on R hand- fingers are catching- in the joints.  All fingers- thumb is the worst- and thumb CMC joint also really painful.  Soaks with epsom salts.   Still "snapping" even after Epsom salts- slathers with lidocaine and put hand in rubber glove- for 45-60 minutes- does several times/day.   Having a lot of hot flashes- will wake her up- affects sleep.   Dr Ophelia Charter gave her shot in 8/23- for R shoulder pain- lasted for awhile-    Wants a back brace- has a little belt- but looking for back brace that will come up to bra line.   Pain Inventory Average Pain 6 Pain Right Now 5 My pain is sharp, burning, stabbing, and tingling  In the last 24 hours, has pain interfered with the following? General activity 7 Relation with others 8 Enjoyment of life 10 What TIME of day is your pain at its worst? morning  and night Sleep (in general) Poor  Pain is worse with: bending, sitting, and standing Pain improves with: rest, heat/ice, and medication Relief from Meds: 8  No family history on file. Social History   Socioeconomic History   Marital status: Widowed    Spouse name: Not on file   Number of children: Not on file   Years of education: Not on file   Highest education level: Not on file  Occupational History   Not on file  Tobacco Use   Smoking status: Former   Smokeless tobacco: Never  Vaping Use   Vaping Use: Never used  Substance and Sexual  Activity   Alcohol use: Not Currently   Drug use: Never   Sexual activity: Not on file  Other Topics Concern   Not on file  Social History Narrative   Not on file   Social Determinants of Health   Financial Resource Strain: Not on file  Food Insecurity: Not on file  Transportation Needs: Not on file  Physical Activity: Not on file  Stress: Not on file  Social Connections: Not on file   Past Surgical History:  Procedure Laterality Date   ABDOMINAL HYSTERECTOMY     BACK SURGERY     NASAL SINUS SURGERY     Past Surgical History:  Procedure Laterality Date   ABDOMINAL HYSTERECTOMY     BACK SURGERY     NASAL SINUS SURGERY     Past Medical History:  Diagnosis Date   Hypertension    Thyroid disease    BP (!) 160/82   Pulse 71   Ht 5\' 3"  (1.6 m)   Wt 164 lb (74.4 kg)   SpO2 98%   BMI 29.05 kg/m   Opioid Risk Score:   Fall Risk Score:  `1  Depression screen [facility] 2/9     08/09/2022    8:39 AM 05/10/2022    9:15 AM 02/04/2022  8:47 AM 10/08/2021    9:27 AM 03/26/2021    9:10 AM 12/29/2020    9:42 AM 10/20/2020    9:39 AM  Depression screen PHQ 2/9  Decreased Interest 1 0 0 0 1 0 0  Down, Depressed, Hopeless 1 0 0 0 1 0 1  PHQ - 2 Score 2 0 0 0 2 0 1  Altered sleeping       3  Tired, decreased energy       3  Change in appetite       0  Feeling bad or failure about yourself        0  Trouble concentrating       0  Moving slowly or fidgety/restless       0  Suicidal thoughts       0  PHQ-9 Score       7     Review of Systems  Musculoskeletal:  Positive for back pain.       Rt arm LT leg pain  All other systems reviewed and are negative.      Objective:   Physical Exam Awake, alert, appropriate, wincing to stand up; R hand in splint- NAD Has trigger fingers in all 5 digits on R hand And TTP over [facility] joint       Assessment & Plan:   Pt is a 77 yr old female with hx of HTN Significant for scoliosis, lumbar stenosis and DDD of lumbar spine and C4/5  cervical collapse- new dx of HLD.  Here for f/u of chronic pain Also has disc collapse at C4/5 based on xray from 2020.  Also has severe R shoulder tendinopathy; has new Dx of Osteoporosis.     Use medical gloves that can be thrown away- so can use hand better to soak with lidocaine.   2. Talk with Gynecologist about hot flashes-estrogen can increase Cardiovascular risk- however talk over with Gyn to determine best treatment-   3. Black Cohosh- is over the counter- can try that first.    4. Has osteoporosis - discuss with PCP Anna Genreonroy- about options- schedule appointment to discuss options- don't do over phone.    5.  Has no kidney function issues, per pt- but can only see labs in 05/2018-   6. Lets  try Steroids- for a few days to help inflammation of R hand- trigger fingers in all fingers- Prednisone 20 mg daily x 4 days- doesn't have DM-   7. Call Dr Ophelia CharterYates about R hand/trigger fingers AND R shoulder injections.    8. Came early and did UDS this AM- is due   9. Due for Norco 7.5/325 mg- soon- will refill since is due in 1 week or so. #90-   10. Will write written Rx for TLSO- corset brace- don't wear more than 4 hours/day. Wrote out Rx.   11. F/U in 3 months-  On chronic pain   12. Made Baclofen an allergy  I spent a total of 32   minutes on total care today- >50% coordination of care- due to d/w pt about trigger finger, UDS, R shoulde rinjection, hot flashes and TLSO brace

## 2022-08-14 ENCOUNTER — Telehealth: Payer: Self-pay | Admitting: *Deleted

## 2022-08-14 LAB — TOXASSURE SELECT,+ANTIDEPR,UR

## 2022-08-14 NOTE — Telephone Encounter (Signed)
Urine drug screen for this encounter is consistent for prescribed medication 

## 2022-08-19 ENCOUNTER — Other Ambulatory Visit (HOSPITAL_COMMUNITY): Payer: Self-pay

## 2022-09-12 ENCOUNTER — Other Ambulatory Visit: Payer: Self-pay | Admitting: Physical Medicine and Rehabilitation

## 2022-09-12 ENCOUNTER — Other Ambulatory Visit (HOSPITAL_COMMUNITY): Payer: Self-pay

## 2022-09-12 MED ORDER — HYDROCODONE-ACETAMINOPHEN 7.5-325 MG PO TABS
1.0000 | ORAL_TABLET | Freq: Three times a day (TID) | ORAL | 0 refills | Status: DC | PRN
  Filled 2022-09-12 – 2022-09-16 (×2): qty 90, 30d supply, fill #0

## 2022-09-12 NOTE — Telephone Encounter (Signed)
Last fill per pmp   Filled  Written  ID  Drug  QTY  Days  Prescriber  RX #  Dispenser  Refill  Daily Dose*  Pymt Type  PMP  08/19/2022 08/09/2022 3  Hydrocodone-Acetamin 7.5-325 90.00 30 Me Lov [id number] Con (0906) 0/0 22.50 MME Other [place]

## 2022-09-16 ENCOUNTER — Other Ambulatory Visit: Payer: Self-pay

## 2022-09-16 ENCOUNTER — Other Ambulatory Visit (HOSPITAL_COMMUNITY): Payer: Self-pay

## 2022-09-17 ENCOUNTER — Other Ambulatory Visit (HOSPITAL_COMMUNITY): Payer: Self-pay

## 2022-10-14 ENCOUNTER — Other Ambulatory Visit (HOSPITAL_COMMUNITY): Payer: Self-pay

## 2022-10-14 ENCOUNTER — Other Ambulatory Visit: Payer: Self-pay | Admitting: Physical Medicine and Rehabilitation

## 2022-10-14 MED ORDER — HYDROCODONE-ACETAMINOPHEN 7.5-325 MG PO TABS
1.0000 | ORAL_TABLET | Freq: Three times a day (TID) | ORAL | 0 refills | Status: DC | PRN
  Filled 2022-10-14: qty 90, 30d supply, fill #0

## 2022-10-14 NOTE — Telephone Encounter (Signed)
PMP was Reviewed.  Dr Berline Chough note was reviewed.  Hydrocodone e-scribed to pharmacy.  Call placed to Ms. Longbottom, no answer. Left message to return the call.

## 2022-10-16 ENCOUNTER — Other Ambulatory Visit (HOSPITAL_COMMUNITY): Payer: Self-pay

## 2022-11-08 ENCOUNTER — Encounter: Payer: Self-pay | Admitting: Physical Medicine and Rehabilitation

## 2022-11-08 ENCOUNTER — Other Ambulatory Visit: Payer: Self-pay

## 2022-11-08 ENCOUNTER — Encounter: Payer: Medicare HMO | Attending: Physical Medicine and Rehabilitation | Admitting: Physical Medicine and Rehabilitation

## 2022-11-08 VITALS — BP 139/67 | HR 69 | Ht 63.0 in | Wt 167.0 lb

## 2022-11-08 DIAGNOSIS — G5701 Lesion of sciatic nerve, right lower limb: Secondary | ICD-10-CM | POA: Insufficient documentation

## 2022-11-08 DIAGNOSIS — M7918 Myalgia, other site: Secondary | ICD-10-CM | POA: Insufficient documentation

## 2022-11-08 DIAGNOSIS — M48061 Spinal stenosis, lumbar region without neurogenic claudication: Secondary | ICD-10-CM | POA: Diagnosis present

## 2022-11-08 DIAGNOSIS — R071 Chest pain on breathing: Secondary | ICD-10-CM | POA: Diagnosis present

## 2022-11-08 DIAGNOSIS — G894 Chronic pain syndrome: Secondary | ICD-10-CM | POA: Diagnosis present

## 2022-11-08 DIAGNOSIS — M25511 Pain in right shoulder: Secondary | ICD-10-CM | POA: Insufficient documentation

## 2022-11-08 MED ORDER — LIDOCAINE 5 % EX PTCH
3.0000 | MEDICATED_PATCH | CUTANEOUS | 5 refills | Status: DC
  Filled 2022-11-08 – 2022-12-12 (×2): qty 90, 30d supply, fill #0
  Filled 2023-02-09 – 2023-02-20 (×3): qty 90, 30d supply, fill #1
  Filled 2023-05-08 – 2023-05-09 (×2): qty 90, 30d supply, fill #2

## 2022-11-08 MED ORDER — HYDROCODONE-ACETAMINOPHEN 7.5-325 MG PO TABS
1.0000 | ORAL_TABLET | Freq: Three times a day (TID) | ORAL | 0 refills | Status: DC | PRN
  Filled 2022-11-08 – 2022-11-13 (×3): qty 90, 30d supply, fill #0

## 2022-11-08 NOTE — Patient Instructions (Signed)
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  Here for f/u of chronic pain Also has disc collapse at C4/5 based on xray from 2020.  Also has severe R shoulder tendinopathy  Severe anxiety out of character for pt- 6/29 (last checked Cards 5 years ago) has new appt Tuesday- so reminded pt that needs to go to ER next time! Because could have a heart attack and didn't know it- esp because had Chest pain. If it occurs again, go to ER!!!!!  2. When speaks to [facility]- remind yourself to mention the Chest pain and Shortness of breath.   3. Still needs to call Dr Ophelia Charter about shoulder injection and R hand trigger fingers. - about the same. 3 fingers now. - prednisone helped for 5 days, but now off it- will call Dr Ophelia Charter about injections.    4. Need to call Gyn about hot flashes   5. Last got refill of Norco 6/10- so about due- will send in- Norco 7.5/325 mg 3x/day #90 Will get at [facility].  Since closer to home.    6. Sent in Lidoderm 3 patches for piriformis and low back pain- #90- 5 refills   7. Sit on tennis ball 10-15 minutes/day- for 30 days, then can do as needed- initially- at least daily.   8. If piriformis doesn't improve within 1 month- with tennis balls and stretching, will send to Dr Jean Rosenthal for OMT. He's a sports medicine doctor that does manipulation- he's a real doctor.   9.  F/U in 3months- but let me know when call for refill if needs referral to Dr Jean Rosenthal.

## 2022-11-08 NOTE — Progress Notes (Signed)
Subjective:    Patient ID: Karla Pineda, female    DOB: 04-Sep-1945, 77 y.o.   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  Here for f/u of chronic pain Also has disc collapse at C4/5 based on xray from 2020.  Also has severe R shoulder tendinopathy    Things about the same- with legs, shoulders, back and piriformis-   Has tingling in legs when walks- has sciatica.  Episode Nov 24, 2022- woke up out of dead sleep- couldn't get breath- severe anxiety- thought would die. Called daughter- and sat there for awhile No change in meds- didn't remember taking BP medicine- hadn't taken in 5 days-  Had just forgotten-  Bottle wasn't there to remind her.  Also didn't take fluid-  just takes 3x/week- 1/2 pill-  Takes Norvasc- and 1/2 fluid pill usually.   Still having dry eyes.  Didn't get a back brace like had planned.  Didn't talk to Gyn about hot flashes.   Back tooth that really hurt- went to dentist- bad sinus infection for weeks-- tooth really bad- he pulled it-  Didn't get pain meds form dentist- since in contract- didn't get it filled.    Was given 5 days of prednisone- finally cleared up/sinuses after prednisone.    Still needs to call Dr Ophelia Charter about shoulder injection and R hand trigger fingers. - about the same. 3 fingers now.   Don't drive a lot anymore- esp when in pain- apprehensive about driving when hurts.   Does use patch- since bad daily.   Doing exercise for piriformis daily- relief for 4 hours.    Pain Inventory Average Pain 8 Pain Right Now 6 My pain is sharp, burning, dull, tingling, and aching  In the last 24 hours, has pain interfered with the following? General activity 5 Relation with others 7 Enjoyment of life 4 What TIME of day is your pain at its worst? morning  and evening Sleep (in general) Poor  Pain is worse with:  walking, bending, inactivity, and standing Pain improves with: rest, heat/ice, medication, TENS, and injections Relief from Meds: 8  use a cane how many minutes can you walk? 15 ability to climb steps?  no do you drive?  yes use a wheelchair needs help with transfers  retired I need assistance with the following:  feeding, dressing, bathing, meal prep, household duties, and shopping  bladder control problems tingling trouble walking spasms dizziness anxiety  Any changes since last visit?  no  Any changes since last visit?  no    No family history on file. Social History   Socioeconomic History   Marital status: Widowed    Spouse name: Not on file   Number of children: Not on file   Years of education: Not on file   Highest education level: Not on file  Occupational History   Not on file  Tobacco Use   Smoking status: Former   Smokeless tobacco: Never  Vaping Use   Vaping Use: Never used  Substance and Sexual Activity   Alcohol use: Not Currently   Drug use: Never   Sexual activity: Not on file  Other Topics Concern   Not on file  Social History Narrative   Not on file   Social Determinants of Health   Financial Resource Strain: Not on file  Food Insecurity: Not  on file  Transportation Needs: Not on file  Physical Activity: Not on file  Stress: Not on file  Social Connections: Not on file   Past Surgical History:  Procedure Laterality Date   ABDOMINAL HYSTERECTOMY     BACK SURGERY     NASAL SINUS SURGERY     Past Medical History:  Diagnosis Date   Hypertension    Thyroid disease    BP 139/67   Pulse 69   Ht 5\' 3"  (1.6 m)   Wt 167 lb (75.8 kg)   SpO2 96%   BMI 29.58 kg/m   Opioid Risk Score:   Fall Risk Score:  `1  Depression screen [facility] 2/9     11/08/2022    9:17 AM 08/09/2022    8:39 AM 05/10/2022    9:15 AM 02/04/2022    8:47 AM 10/08/2021    9:27 AM 03/26/2021    9:10 AM 12/29/2020    9:42 AM  Depression screen PHQ 2/9  Decreased  Interest 1 1 0 0 0 1 0  Down, Depressed, Hopeless 1 1 0 0 0 1 0  PHQ - 2 Score 2 2 0 0 0 2 0     Review of Systems  Musculoskeletal:  Positive for back pain, gait problem and neck pain.       Spasms  Neurological:  Positive for dizziness.  Psychiatric/Behavioral:  The patient is nervous/anxious.   All other systems reviewed and are negative.     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  Here for f/u of chronic pain Also has disc collapse at C4/5 based on xray from 2020.  Also has severe R shoulder tendinopathy  Severe anxiety out of character for pt- 6/29 (last checked Cards 5 years ago) has new appt Tuesday- so reminded pt that needs to go to ER next time! Because could have a heart attack and didn't know it- esp because had Chest pain. If it occurs again, go to ER!!!!!  2. When speaks to [facility]- remind yourself to mention the Chest pain and Shortness of breath.   3. Still needs to call Dr Ophelia Charter about shoulder injection and R hand trigger fingers. - about the same. 3 fingers now. - prednisone helped for 5 days, but now off it- will call Dr Ophelia Charter about injections.    4. Need to call Gyn about hot flashes   5. Last got refill of Norco 6/10- so about due- will send in- Norco 7.5/325 mg 3x/day #90 Will get at [facility].  Since closer to home.    6. Sent in Lidoderm 3 patches for piriformis and low back pain- #90- 5 refills   7. Sit on tennis ball 10-15 minutes/day- for 30 days, then can do as needed- initially- at least daily.   8. If piriformis doesn't improve within 1 month- with tennis balls and stretching, will send to Dr Jean Rosenthal for OMT. He's a sports medicine doctor that does manipulation- he's a  real doctor.   9.  F/U in 3months- but let me know when call for refill if needs referral to Dr Jean Rosenthal.    I spent a total of 34   minutes on total care today- >  50% coordination of care- due to  Discussion how to treat pain- as detailed above.

## 2022-11-13 ENCOUNTER — Other Ambulatory Visit: Payer: Self-pay

## 2022-11-18 ENCOUNTER — Other Ambulatory Visit: Payer: Self-pay | Admitting: Internal Medicine

## 2022-11-18 DIAGNOSIS — R079 Chest pain, unspecified: Secondary | ICD-10-CM

## 2022-12-03 ENCOUNTER — Telehealth: Payer: Self-pay

## 2022-12-03 NOTE — Telephone Encounter (Signed)
PA for Lidocaine 5% Patch submitted to Cover My Meds

## 2022-12-05 ENCOUNTER — Other Ambulatory Visit: Payer: Self-pay | Admitting: Physical Medicine and Rehabilitation

## 2022-12-05 ENCOUNTER — Other Ambulatory Visit: Payer: Self-pay

## 2022-12-05 MED FILL — Hydrocodone-Acetaminophen Tab 7.5-325 MG: ORAL | 30 days supply | Qty: 90 | Fill #0 | Status: CN

## 2022-12-06 ENCOUNTER — Other Ambulatory Visit: Payer: Self-pay

## 2022-12-06 NOTE — Telephone Encounter (Signed)
Rx under review after denial.

## 2022-12-09 ENCOUNTER — Other Ambulatory Visit: Payer: Self-pay

## 2022-12-10 ENCOUNTER — Telehealth (HOSPITAL_COMMUNITY): Payer: Self-pay | Admitting: *Deleted

## 2022-12-10 MED ORDER — METOPROLOL TARTRATE 50 MG PO TABS: ORAL_TABLET | ORAL | 0 refills | Status: DC

## 2022-12-10 NOTE — Telephone Encounter (Signed)
Reaching out to patient to offer assistance regarding upcoming cardiac imaging study; pt verbalizes understanding of appt date/time, parking situation and where to check in, pre-test NPO status and medications ordered, and verified current allergies; name and call back number provided for further questions should they arise Johney Frame RN Navigator Cardiac Imaging [facility] [phone] office [phone] cell   50 mg metoprolol tartrate sent to preferred pharmacy.

## 2022-12-11 ENCOUNTER — Other Ambulatory Visit: Payer: Self-pay

## 2022-12-11 ENCOUNTER — Ambulatory Visit: Admission: RE | Admit: 2022-12-11 | Payer: Medicare HMO | Source: Ambulatory Visit

## 2022-12-11 MED FILL — Hydrocodone-Acetaminophen Tab 7.5-325 MG: ORAL | 30 days supply | Qty: 90 | Fill #0 | Status: AC

## 2022-12-12 ENCOUNTER — Other Ambulatory Visit: Payer: Self-pay

## 2022-12-12 NOTE — Telephone Encounter (Signed)
Lidocaine patch approved 05/06/2022-04/26/2023.

## 2022-12-13 ENCOUNTER — Other Ambulatory Visit: Payer: Self-pay

## 2022-12-17 ENCOUNTER — Encounter (HOSPITAL_COMMUNITY): Payer: Self-pay

## 2022-12-17 ENCOUNTER — Other Ambulatory Visit (HOSPITAL_COMMUNITY): Payer: Self-pay | Admitting: *Deleted

## 2022-12-18 ENCOUNTER — Telehealth (HOSPITAL_COMMUNITY): Payer: Self-pay | Admitting: *Deleted

## 2022-12-18 NOTE — Telephone Encounter (Signed)
Reaching out to patient to offer assistance regarding upcoming cardiac imaging study; pt verbalizes understanding of appt date/time, parking situation and where to check in, pre-test NPO status and medications ordered, and verified current allergies; name and call back number provided for further questions should they arise Hayley Sharpe RN Navigator Cardiac Imaging [facility] [phone] office [phone] cell  

## 2022-12-19 ENCOUNTER — Ambulatory Visit (HOSPITAL_COMMUNITY)
Admission: RE | Admit: 2022-12-19 | Discharge: 2022-12-19 | Disposition: A | Discharge status: Home / Self Care | Payer: Medicare HMO | Source: Ambulatory Visit | Attending: Internal Medicine | Admitting: Internal Medicine

## 2022-12-19 DIAGNOSIS — I700 Atherosclerosis of aorta: Secondary | ICD-10-CM | POA: Diagnosis not present

## 2022-12-19 DIAGNOSIS — I2510 Atherosclerotic heart disease of native coronary artery without angina pectoris: Secondary | ICD-10-CM | POA: Diagnosis not present

## 2022-12-19 DIAGNOSIS — R079 Chest pain, unspecified: Secondary | ICD-10-CM | POA: Diagnosis present

## 2022-12-19 MED ORDER — NITROGLYCERIN 0.4 MG SL SUBL
0.8000 mg | SUBLINGUAL_TABLET | Freq: Once | SUBLINGUAL | Status: AC
  Administered 2022-12-19: 0.8 mg via SUBLINGUAL
  Filled 2022-12-19: qty 25

## 2022-12-19 MED ORDER — IOHEXOL 350 MG/ML SOLN
80.0000 mL | Freq: Once | INTRAVENOUS | Status: AC | PRN
  Administered 2022-12-19: 80 mL via INTRAVENOUS

## 2022-12-19 NOTE — Progress Notes (Signed)

## 2022-12-23 ENCOUNTER — Other Ambulatory Visit: Payer: Medicare HMO

## 2023-01-06 ENCOUNTER — Other Ambulatory Visit: Payer: Self-pay | Admitting: Physical Medicine and Rehabilitation

## 2023-01-07 ENCOUNTER — Other Ambulatory Visit: Payer: Self-pay

## 2023-01-07 ENCOUNTER — Other Ambulatory Visit: Payer: Self-pay | Admitting: Physical Medicine and Rehabilitation

## 2023-01-07 MED FILL — Hydrocodone-Acetaminophen Tab 7.5-325 MG: ORAL | 30 days supply | Qty: 90 | Fill #0 | Status: CN

## 2023-01-07 NOTE — Telephone Encounter (Signed)
Dr Berline Chough Note was reviewed.  PMP was Reviewed.  Hydrocodone was re-filled  Call placed to Ms. Hagerman regarding the above, she verbalizes understanding.

## 2023-01-08 ENCOUNTER — Other Ambulatory Visit: Payer: Self-pay

## 2023-01-08 MED FILL — Hydrocodone-Acetaminophen Tab 7.5-325 MG: ORAL | 30 days supply | Qty: 90 | Fill #0 | Status: AC

## 2023-02-07 ENCOUNTER — Encounter: Payer: Self-pay | Admitting: Physical Medicine and Rehabilitation

## 2023-02-07 ENCOUNTER — Other Ambulatory Visit: Payer: Self-pay

## 2023-02-07 ENCOUNTER — Encounter: Payer: Medicare HMO | Attending: Physical Medicine and Rehabilitation | Admitting: Physical Medicine and Rehabilitation

## 2023-02-07 VITALS — BP 136/83 | HR 69 | Ht 63.0 in | Wt 163.0 lb

## 2023-02-07 DIAGNOSIS — G894 Chronic pain syndrome: Secondary | ICD-10-CM | POA: Diagnosis not present

## 2023-02-07 DIAGNOSIS — J4540 Moderate persistent asthma, uncomplicated: Secondary | ICD-10-CM | POA: Diagnosis present

## 2023-02-07 DIAGNOSIS — M4126 Other idiopathic scoliosis, lumbar region: Secondary | ICD-10-CM | POA: Insufficient documentation

## 2023-02-07 DIAGNOSIS — G5701 Lesion of sciatic nerve, right lower limb: Secondary | ICD-10-CM | POA: Diagnosis not present

## 2023-02-07 DIAGNOSIS — Z5181 Encounter for therapeutic drug level monitoring: Secondary | ICD-10-CM | POA: Insufficient documentation

## 2023-02-07 DIAGNOSIS — J45909 Unspecified asthma, uncomplicated: Secondary | ICD-10-CM | POA: Insufficient documentation

## 2023-02-07 MED ORDER — HYDROCODONE-ACETAMINOPHEN 7.5-325 MG PO TABS
1.0000 | ORAL_TABLET | Freq: Three times a day (TID) | ORAL | 0 refills | Status: DC | PRN
  Filled 2023-02-07: qty 90, 30d supply, fill #0

## 2023-02-07 MED ORDER — HYDROCODONE-ACETAMINOPHEN 7.5-325 MG PO TABS
1.0000 | ORAL_TABLET | Freq: Three times a day (TID) | ORAL | 0 refills | Status: DC | PRN
  Filled 2023-02-07 – 2023-03-10 (×2): qty 90, 30d supply, fill #0

## 2023-02-07 NOTE — Patient Instructions (Signed)
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  Here for f/u of chronic pain Also has disc collapse at C4/5 based on xray from 2020.  Also has severe R shoulder tendinopathy    Will send to [facility] to see every 2 months and see me in between.   2. Due for UDS today- since last done in April 2024- per clinic/policy.   3.  Send in Norco 7/5 /325 mg 3x/day as needed #90- sent in to [facility] pharmacy    4.  Suggest Cards look into Zetia. Will see in February- or earlier-    5.  Pursed mouth/lip breathing.  When cannot get a good breath.  When the expiration of your breath is much longer than inspiration, that's an asthma attack and use your inhaler  Also taught how to use inhaler.  - use hand to make a tube to put inhaler into- Remember to breathe all the way out- and then as you inhale, push inhaler-  Hold 10 seconds or as long up to 10 seconds as you can Wait 1 minute before next dose.  You can take up to FOUR puffs a minute apart- if you ares till short of breath after that- check you oxygen saturation.  Want it 97% or above- but sats can dip fast if gets too tired- so count how many breaths/minute, if if staying above 20-22 breaths/minute, then monitor closely.  Do not lay down or go to sleep until respiation is less than 20 breaths/minute.  Then if after 20-30 minutes and after 4 puffs, and still shor tof breath, call 911.   6. Make appointment with Dr Ophelia Charter for finger and R shoulder.   7. Added Statin to allergies.   8.  Insurance now covering lidoderm.   9.  Cannot use tennis ball- doesn't help, but is doing stretching- sometimes 3x/day- but at least daily- con't that and lidoderm patches.   10.   F/U in 2 months with Riley Lam and me in 4 months.

## 2023-02-07 NOTE — Progress Notes (Signed)
Subjective:    Patient ID: JAMESON GHARIB, female    DOB: [birth year], 77 y.o.   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  Here for f/u of chronic pain Also has disc collapse at C4/5 based on xray from 2020.  Also has severe R shoulder tendinopathy   Things about the same Has had some appointments since last saw me.   Saw Cards and Pulmonary- gamut of tests- found nothing- no blockages; keep up with what she's doing- tiny bit of calcium, but not enough to worry about.   Breathing tests- by Pulmonary- was getting worse- has asthma- hard ot breathe at night-  Has asthma, but no other issues- was given an inhaler- did help some.  All other tests were negative.  Can bring on anxiety- when cannot get a good breath.   Put him on Crestor- thought was having a stroke-  head felt funny- electrical discharges in L side of head- L side of head- and down to LUE- down to fingertips and down L leg-  Was numb all L side.  Nothing else was new-   Went away after 30 minutes- was going away.  Next day was OK.  Occurred 3 different times.  Didn't put her on anything else- Cards topped Statin.  Scared her really bad.     Pain Inventory Average Pain 7 Pain Right Now 5 My pain is constant, sharp, burning, tingling, and aching  In the last 24 hours, has pain interfered with the following? General activity 7 Relation with others 4 Enjoyment of life 9 What TIME of day is your pain at its worst? morning , evening, and night Sleep (in general) Poor  Pain is worse with: walking, bending, sitting, standing, and some activites Pain improves with: rest, heat/ice, therapy/exercise, medication, and injections Relief from Meds: 9  No family history on file. Social History   Socioeconomic History   Marital status: Widowed    Spouse name: Not on file   Number of  children: Not on file   Years of education: Not on file   Highest education level: Not on file  Occupational History   Not on file  Tobacco Use   Smoking status: Former   Smokeless tobacco: Never  Vaping Use   Vaping status: Never Used  Substance and Sexual Activity   Alcohol use: Not Currently   Drug use: Never   Sexual activity: Not on file  Other Topics Concern   Not on file  Social History Narrative   Not on file   Social Determinants of Health   Financial Resource Strain: Medium Risk (11/24/2022)   Received from [facility]   Overall Financial Resource Strain (CARDIA)    Difficulty of Paying Living Expenses: Somewhat hard  Food Insecurity: Food Insecurity Present (11/24/2022)   Received from [facility]   Hunger Vital Sign    Worried About Running Out of Food in the Last Year: Sometimes true    Ran Out of Food in the Last Year: Sometimes true  Transportation Needs: No Transportation Needs (11/24/2022)   Received from [facility] - Transportation    In the past 12 months, has lack of transportation kept you from medical appointments or from getting medications?: No    Lack of Transportation (Non-Medical): No  Physical  Activity: Not on file  Stress: Not on file  Social Connections: Not on file   Past Surgical History:  Procedure Laterality Date   ABDOMINAL HYSTERECTOMY     BACK SURGERY     NASAL SINUS SURGERY     Past Surgical History:  Procedure Laterality Date   ABDOMINAL HYSTERECTOMY     BACK SURGERY     NASAL SINUS SURGERY     Past Medical History:  Diagnosis Date   Hypertension    Thyroid disease    There were no vitals taken for this visit.  Opioid Risk Score:   Fall Risk Score:  `1  Depression screen [facility] 2/9     11/08/2022    9:17 AM 08/09/2022    8:39 AM 05/10/2022    9:15 AM 02/04/2022    8:47 AM 10/08/2021    9:27 AM 03/26/2021    9:10 AM 12/29/2020    9:42 AM  Depression screen PHQ  2/9  Decreased Interest 1 1 0 0 0 1 0  Down, Depressed, Hopeless 1 1 0 0 0 1 0  PHQ - 2 Score 2 2 0 0 0 2 0    Review of Systems  Musculoskeletal:  Positive for back pain and neck pain.       Right arm pain, right leg pain, right hand pain, pain in both legs  All other systems reviewed and are negative.      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  Here for f/u of chronic pain Also has disc collapse at C4/5 based on xray from 2020.  Also has severe R shoulder tendinopathy    Will send to [facility] to see every 2 months and see me in between.   2. Due for UDS today- since last done in April 2024- per clinic/policy.   3.  Send in Norco 7/5 /325 mg 3x/day as needed #90- sent in to [facility] pharmacy- sent for 2 months    4.  Suggest Cards look into Zetia. Will see in February- or earlier-    5.  Pursed mouth/lip breathing.  When cannot get a good breath.  When the expiration of your breath is much longer than inspiration, that's an asthma attack and use your inhaler  Also taught how to use inhaler.  - use hand to make a tube to put inhaler into- Remember to breathe all the way out- and then as you inhale, push inhaler-  Hold 10 seconds or as long up to 10 seconds as you can Wait 1 minute before next dose.  You can take up to FOUR puffs a minute apart- if you ares till short of breath after that- check you oxygen saturation.  Want it 97% or above- but sats can dip fast if gets too tired- so count how many breaths/minute, if if staying above 20-22 breaths/minute, then monitor closely.  Do not lay down or go to sleep until respiation is less than 20 breaths/minute.  Then if after 20-30 minutes and after 4 puffs, and still shor tof  breath, call 911.   6. Make appointment with Dr Ophelia Charter for finger and R shoulder.   7. Added Statin to allergies.   8.  Insurance now covering lidoderm.   9.  Cannot use tennis ball- doesn't help, but is doing stretching- sometimes 3x/day- but at least daily- con't that and lidoderm patches.   10.   F/U in 2 months with Riley Lam and me in 4 months.   11. If uses inhaler more than 4x/week, needs a scheduled med- suggest singulair   I spent a total of 34   minutes on total care today- >50% coordination of care- due to d/w pt how to use inhaler, how to handle Asthma/SOB; as well as pain meds- sent pain meds for 2 months

## 2023-02-09 ENCOUNTER — Other Ambulatory Visit: Payer: Self-pay

## 2023-02-12 ENCOUNTER — Other Ambulatory Visit: Payer: Self-pay

## 2023-02-12 LAB — TOXASSURE SELECT,+ANTIDEPR,UR

## 2023-02-14 ENCOUNTER — Ambulatory Visit: Payer: Medicare HMO | Admitting: Orthopaedic Surgery

## 2023-02-21 ENCOUNTER — Ambulatory Visit: Payer: Medicare HMO | Admitting: Orthopaedic Surgery

## 2023-02-21 VITALS — Ht 63.0 in | Wt 163.0 lb

## 2023-02-21 DIAGNOSIS — M7541 Impingement syndrome of right shoulder: Secondary | ICD-10-CM | POA: Diagnosis not present

## 2023-02-21 MED ORDER — BUPIVACAINE HCL 0.25 % IJ SOLN
4.0000 mL | INTRAMUSCULAR | Status: AC | PRN
Start: 2023-02-21 — End: 2023-02-21
  Administered 2023-02-21: 4 mL via INTRA_ARTICULAR

## 2023-02-21 MED ORDER — METHYLPREDNISOLONE ACETATE 40 MG/ML IJ SUSP
40.0000 mg | INTRAMUSCULAR | Status: AC | PRN
Start: 2023-02-21 — End: 2023-02-21
  Administered 2023-02-21: 40 mg via INTRA_ARTICULAR

## 2023-02-21 MED ORDER — LIDOCAINE HCL 1 % IJ SOLN
0.5000 mL | INTRAMUSCULAR | Status: AC | PRN
Start: 2023-02-21 — End: 2023-02-21
  Administered 2023-02-21: .5 mL

## 2023-02-21 NOTE — Progress Notes (Signed)
Office Visit Note   Patient: Karla Pineda           Date of Birth: [birth year]           MRN: [id number] Visit Date: 02/21/2023              Requested by: Lonie Peak, PA-C [address] [place],  [place] [address] PCP: Lonie Peak, PA-C   Assessment & Plan: Visit Diagnoses:  1. Impingement syndrome of right shoulder     Plan: Patient tolerated subacromial injection.  If she has increased problems she will let us know we can consider repeat imaging studies of her shoulder consider shoulder arthroscopy with biceps release versus tenodesis and possible rotator cuff repair.  Follow-Up Instructions: No follow-ups on file.   Orders:  No orders of the defined types were placed in this encounter.  No orders of the defined types were placed in this encounter.     Procedures: Large Joint Inj: R subacromial bursa on 02/21/2023 1:17 PM Indications: pain Details: 22 G 1.5 in needle  Arthrogram: No  Medications: 4 mL bupivacaine 0.25 %; 40 mg methylPREDNISolone acetate 40 MG/ML; 0.5 mL lidocaine 1 % Outcome: tolerated well, no immediate complications Procedure, treatment alternatives, risks and benefits explained, specific risks discussed. Consent was given by the patient. Immediately prior to procedure a time out was called to verify the correct patient, procedure, equipment, support staff and site/side marked as required. Patient was prepped and draped in the usual sterile fashion.       Clinical Data: No additional findings.   Subjective: Chief Complaint  Patient presents with   Right Shoulder - Pain    HPI 77 year old female returns with recurrent problems with the right shoulder.  Previous injection 1 year ago with some prominent supraspinatus and moderate infraspinatus and subscap tendinopathy with prominent biceps tendinopathy and partial tearing.  After the injection she did well up until couple months ago started having recurrent problems and problems  reaching overhead.  She is requesting repeat injection.  Review of Systems updated unchanged previous surgery she has significant scoliosis with mild central narrowing.   Objective: Vital Signs: Ht 5\' 3"  (1.6 m)   Wt 163 lb (73.9 kg)   BMI 28.87 kg/m   Physical Exam Constitutional:      Appearance: She is well-developed.  HENT:     Head: Normocephalic.     Right Ear: External ear normal.     Left Ear: External ear normal. There is no impacted cerumen.  Eyes:     Pupils: Pupils are equal, round, and reactive to light.  Neck:     Thyroid: No thyromegaly.     Trachea: No tracheal deviation.  Cardiovascular:     Rate and Rhythm: Normal rate.  Pulmonary:     Effort: Pulmonary effort is normal.  Abdominal:     Palpations: Abdomen is soft.  Musculoskeletal:     Cervical back: No rigidity.  Skin:    General: Skin is warm and dry.  Neurological:     Mental Status: She is alert and oriented to person, place, and time.  Psychiatric:        Behavior: Behavior normal.     Ortho Exam positive impingement Long head biceps is tender.  No rash over exposed skin.  Good cervical range of motion.  Specialty Comments:  No specialty comments available.  Imaging: No results found.   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  Past Surgical History:  Procedure Laterality Date   ABDOMINAL HYSTERECTOMY     BACK SURGERY     NASAL SINUS SURGERY     Social History   Occupational History   Not on file  Tobacco Use   Smoking status: Former   Smokeless tobacco: Never  Vaping Use   Vaping status: Never Used  Substance and Sexual Activity   Alcohol use: Not Currently   Drug use: Never   Sexual activity: Not on file

## 2023-03-06 ENCOUNTER — Other Ambulatory Visit: Payer: Self-pay

## 2023-03-10 ENCOUNTER — Other Ambulatory Visit: Payer: Self-pay

## 2023-04-07 ENCOUNTER — Ambulatory Visit: Payer: Medicare HMO | Admitting: Registered Nurse

## 2023-04-08 ENCOUNTER — Encounter: Payer: Medicare HMO | Attending: Physical Medicine and Rehabilitation | Admitting: Registered Nurse

## 2023-04-08 ENCOUNTER — Other Ambulatory Visit: Payer: Self-pay

## 2023-04-08 VITALS — BP 159/79 | HR 62 | Ht 63.0 in | Wt 160.0 lb

## 2023-04-08 DIAGNOSIS — Z5181 Encounter for therapeutic drug level monitoring: Secondary | ICD-10-CM | POA: Insufficient documentation

## 2023-04-08 DIAGNOSIS — M7918 Myalgia, other site: Secondary | ICD-10-CM | POA: Diagnosis present

## 2023-04-08 DIAGNOSIS — I10 Essential (primary) hypertension: Secondary | ICD-10-CM | POA: Diagnosis present

## 2023-04-08 DIAGNOSIS — M4126 Other idiopathic scoliosis, lumbar region: Secondary | ICD-10-CM | POA: Diagnosis present

## 2023-04-08 DIAGNOSIS — G894 Chronic pain syndrome: Secondary | ICD-10-CM | POA: Insufficient documentation

## 2023-04-08 DIAGNOSIS — M2550 Pain in unspecified joint: Secondary | ICD-10-CM | POA: Insufficient documentation

## 2023-04-08 DIAGNOSIS — M5416 Radiculopathy, lumbar region: Secondary | ICD-10-CM | POA: Diagnosis not present

## 2023-04-08 MED ORDER — HYDROCODONE-ACETAMINOPHEN 7.5-325 MG PO TABS
1.0000 | ORAL_TABLET | Freq: Three times a day (TID) | ORAL | 0 refills | Status: DC | PRN
  Filled 2023-04-08: qty 90, 30d supply, fill #0

## 2023-04-08 NOTE — Progress Notes (Signed)
Subjective:    Patient ID: Karla Pineda, female    DOB: April 16, 1946, 77 y.o.   MRN: [id number]  HPI: Karla Pineda is a 77 y.o. female who returns for follow up appointment for chronic pain and medication refill. She states her pain is located in her lower back radiating into her bilateral lower extremities and generalized joint pain. She rates her pain 5. Her current exercise regime is walking and performing stretching exercises.  Karla Pineda arrived to office hypertensive, blood pressure was re-checked, she stated she didn't take her anti-hypertensive medication. She was educated on medication compliance, she verbalizes understanding. She will keep a blood pressure log and F/U with her PCP.   Karla Pineda Morphine equivalent is 22.50 MME.   UDS ordered today.   Pain Inventory Average Pain 6 Pain Right Now 5 My pain is constant, sharp, burning, stabbing, and tingling  In the last 24 hours, has pain interfered with the following? General activity 9 Relation with others 9 Enjoyment of life 9 What TIME of day is your pain at its worst? morning  and night Sleep (in general) Poor  Pain is worse with: walking, sitting, and standing Pain improves with: rest, heat/ice, therapy/exercise, medication, and injections Relief from Meds: 5  No family history on file. Social History   Socioeconomic History   Marital status: Widowed    Spouse name: Not on file   Number of children: Not on file   Years of education: Not on file   Highest education level: Not on file  Occupational History   Not on file  Tobacco Use   Smoking status: Former   Smokeless tobacco: Never  Vaping Use   Vaping status: Never Used  Substance and Sexual Activity   Alcohol use: Not Currently   Drug use: Never   Sexual activity: Not on file  Other Topics Concern   Not on file  Social History Narrative   Not on file   Social Determinants of Health   Financial Resource Strain: Medium Risk (11/24/2022)    Received from [facility]   Overall Financial Resource Strain (CARDIA)    Difficulty of Paying Living Expenses: Somewhat hard  Food Insecurity: Food Insecurity Present (11/24/2022)   Received from [facility]   Hunger Vital Sign    Worried About Running Out of Food in the Last Year: Sometimes true    Ran Out of Food in the Last Year: Sometimes true  Transportation Needs: No Transportation Needs (11/24/2022)   Received from [facility] - Transportation    In the past 12 months, has lack of transportation kept you from medical appointments or from getting medications?: No    Lack of Transportation (Non-Medical): No  Physical Activity: Not on file  Stress: Not on file  Social Connections: Not on file   Past Surgical History:  Procedure Laterality Date   ABDOMINAL HYSTERECTOMY     BACK SURGERY     NASAL SINUS SURGERY     Past Surgical History:  Procedure Laterality Date   ABDOMINAL HYSTERECTOMY     BACK SURGERY     NASAL SINUS SURGERY     Past Medical History:  Diagnosis Date   Hypertension    Thyroid disease    Ht 5\' 3"  (1.6 m)   Wt 160 lb (72.6 kg)   BMI 28.34 kg/m   Opioid Risk Score:   Fall Risk Score:  `1  Depression screen [facility] 2/9  02/07/2023    8:55 AM 11/08/2022    9:17 AM 08/09/2022    8:39 AM 05/10/2022    9:15 AM 02/04/2022    8:47 AM 10/08/2021    9:27 AM 03/26/2021    9:10 AM  Depression screen PHQ 2/9  Decreased Interest 1 1 1  0 0 0 1  Down, Depressed, Hopeless 1 1 1  0 0 0 1  PHQ - 2 Score 2 2 2  0 0 0 2      Review of Systems  Musculoskeletal:  Positive for back pain and neck pain.       Right arm and shoulder pain       Objective:   Physical Exam Vitals and nursing note reviewed.  Constitutional:      Appearance: Normal appearance.  Cardiovascular:     Rate and Rhythm: Normal rate and regular rhythm.     Pulses: Normal pulses.  Pulmonary:     Effort: Pulmonary effort is normal.      Breath sounds: Normal breath sounds.  Musculoskeletal:     Comments: Normal Muscle Bulk and Muscle Testing Reveals:  Upper Extremities:Decreased  ROM and Muscle Strength 5/5 Bilateral AC Joint Tenderness  Lumbar Hypersensitivity Lower Extremities: Full ROM and Muscle Strength 5/5 Arises from Table slowly     Skin:    General: Skin is warm and dry.  Neurological:     Mental Status: She is alert and oriented to person, place, and time.  Psychiatric:        Mood and Affect: Mood normal.        Behavior: Behavior normal.          Assessment & Plan:  Idiopathic Scoliosis/ Lumbar/ Lumbar Radiculitis: Continue HEP as Tolerated. Continue to Monitor.  Myofascial Pain: Continue HEP as Tolerated. Continue to Monitor.  Polyarthralgia: Continue HEP as Tolerated. Continue to Monitor.  Essential Hypertension: Karla Pineda was educated on medication compliance, she verbalizes understanding.  Chronic Pain Syndrome: Refilled: Hydrocodone 7.5/325 one tablet three times a day as needed for pain #90. We will continue the opioid monitoring program, this consists of regular clinic visits, examinations, urine drug screen, pill counts as well as use of [place] Controlled Substance Reporting system. A 12 month History has been reviewed on the [place] Controlled Substance Reporting System on 04/08/2023  F/U in 2 months

## 2023-04-09 ENCOUNTER — Other Ambulatory Visit: Payer: Self-pay

## 2023-04-09 MED ORDER — HYDROCODONE-ACETAMINOPHEN 7.5-325 MG PO TABS
1.0000 | ORAL_TABLET | Freq: Three times a day (TID) | ORAL | 0 refills | Status: DC | PRN
  Filled 2023-04-09 – 2023-05-08 (×2): qty 90, 30d supply, fill #0
  Filled ????-??-??: fill #0

## 2023-04-13 ENCOUNTER — Encounter: Payer: Self-pay | Admitting: Registered Nurse

## 2023-04-14 ENCOUNTER — Other Ambulatory Visit: Payer: Self-pay

## 2023-05-05 ENCOUNTER — Other Ambulatory Visit: Payer: Self-pay

## 2023-05-08 ENCOUNTER — Other Ambulatory Visit: Payer: Self-pay

## 2023-05-09 ENCOUNTER — Telehealth: Payer: Self-pay

## 2023-05-09 ENCOUNTER — Other Ambulatory Visit: Payer: Self-pay

## 2023-05-09 ENCOUNTER — Other Ambulatory Visit: Payer: Self-pay | Admitting: Physical Medicine and Rehabilitation

## 2023-05-09 NOTE — Telephone Encounter (Signed)
 PA submitted for lidocaine patches  Karla Pineda (Key: [id number])

## 2023-05-09 NOTE — Telephone Encounter (Signed)
 Approved valid 05/07/23-05/08/24

## 2023-05-12 ENCOUNTER — Other Ambulatory Visit: Payer: Self-pay

## 2023-06-04 ENCOUNTER — Other Ambulatory Visit: Payer: Self-pay

## 2023-06-04 ENCOUNTER — Other Ambulatory Visit: Payer: Self-pay | Admitting: Registered Nurse

## 2023-06-04 MED ORDER — HYDROCODONE-ACETAMINOPHEN 7.5-325 MG PO TABS
1.0000 | ORAL_TABLET | Freq: Three times a day (TID) | ORAL | 0 refills | Status: DC | PRN
  Filled 2023-06-04 – 2023-06-05 (×2): qty 90, 30d supply, fill #0

## 2023-06-04 NOTE — Telephone Encounter (Signed)
PMP was Reviewed.  Hydrocodone e-scribed to pharmacy. Ms. Kushner is aware via My Chart  message.

## 2023-06-05 ENCOUNTER — Other Ambulatory Visit: Payer: Self-pay

## 2023-06-09 ENCOUNTER — Ambulatory Visit: Payer: Medicare HMO | Admitting: Physical Medicine and Rehabilitation

## 2023-06-11 ENCOUNTER — Encounter: Payer: Self-pay | Admitting: Physical Medicine and Rehabilitation

## 2023-06-11 ENCOUNTER — Encounter
Payer: No Typology Code available for payment source | Attending: Physical Medicine and Rehabilitation | Admitting: Physical Medicine and Rehabilitation

## 2023-06-11 ENCOUNTER — Other Ambulatory Visit: Payer: Self-pay

## 2023-06-11 VITALS — BP 134/80 | HR 76 | Ht 63.0 in | Wt 163.4 lb

## 2023-06-11 DIAGNOSIS — G8929 Other chronic pain: Secondary | ICD-10-CM | POA: Diagnosis present

## 2023-06-11 DIAGNOSIS — M5441 Lumbago with sciatica, right side: Secondary | ICD-10-CM | POA: Insufficient documentation

## 2023-06-11 DIAGNOSIS — G894 Chronic pain syndrome: Secondary | ICD-10-CM | POA: Insufficient documentation

## 2023-06-11 DIAGNOSIS — M48061 Spinal stenosis, lumbar region without neurogenic claudication: Secondary | ICD-10-CM | POA: Insufficient documentation

## 2023-06-11 DIAGNOSIS — M5442 Lumbago with sciatica, left side: Secondary | ICD-10-CM | POA: Insufficient documentation

## 2023-06-11 MED ORDER — HYDROCODONE-ACETAMINOPHEN 7.5-325 MG PO TABS
1.0000 | ORAL_TABLET | Freq: Four times a day (QID) | ORAL | 0 refills | Status: DC | PRN
  Filled 2023-06-11: qty 90, 23d supply, fill #0

## 2023-06-11 MED ORDER — HYDROCODONE-ACETAMINOPHEN 7.5-325 MG PO TABS
1.0000 | ORAL_TABLET | Freq: Three times a day (TID) | ORAL | 0 refills | Status: DC | PRN
  Filled 2023-06-11 – 2023-07-03 (×2): qty 90, 30d supply, fill #0

## 2023-06-11 MED ORDER — GABAPENTIN 300 MG PO CAPS: 300.0000 mg | ORAL_CAPSULE | Freq: Every day | ORAL | 5 refills | Status: DC

## 2023-06-11 NOTE — Patient Instructions (Signed)
 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  Here for f/u of chronic pain Also has disc collapse at C4/5 based on xray from 2020.  Also has severe R shoulder tendinopathy  Con't Lidoderm  for now- using on legs  2. Con't Norco 7.5/325 mg up to 3x/day as needed # 90- 2 months refill.    3. Last UDS 02/07/23- so isn't due per lcinic policy.    4. Con't stretching- at least 2x/day.    5. Will see Fidela next 2 months- and then f/u- with me in 4 months-    6. Can ask Pulmonary about nebulizer machine.    7. Add Gabapentin  300 mg nightly x 1 week, then 300 mg 2x/day- if makes too sleepy, take both pills at night.

## 2023-06-11 NOTE — Progress Notes (Signed)
 Subjective:    Patient ID: Karla Pineda, female    DOB: 02/29/1946, 78 y.o.   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  Here for f/u of chronic pain Also has disc collapse at C4/5 based on xray from 2020.  Also has severe R shoulder tendinopathy   Weather making pain much worse.  Still stretching- at least 2x/day- in AM and afternoon.  Tries to do things during day.   Trying to get flexibility and range of motion.  Putting lidoderm  on legs.   Hasn't tried gabapentin - but has hypersensitivity to lyrica      Pain Inventory Average Pain 7 Pain Right Now 7 My pain is intermittent, sharp, burning, dull, stabbing, tingling, and aching  In the last 24 hours, has pain interfered with the following? General activity 8 Relation with others 0 Enjoyment of life 5 What TIME of day is your pain at its worst? morning , night, and varies Sleep (in general) Poor  Pain is worse with: walking, bending, sitting, standing, and some activites Pain improves with: rest and medication Relief from Meds: 8  No family history on file. Social History   Socioeconomic History   Marital status: Widowed    Spouse name: Not on file   Number of children: Not on file   Years of education: Not on file   Highest education level: Not on file  Occupational History   Not on file  Tobacco Use   Smoking status: Former   Smokeless tobacco: Never  Vaping Use   Vaping status: Never Used  Substance and Sexual Activity   Alcohol use: Not Currently   Drug use: Never   Sexual activity: Not on file  Other Topics Concern   Not on file  Social History Narrative   Not on file   Social Drivers of Health   Financial Resource Strain: Medium Risk (11/24/2022)   Received from [facility]   Overall Financial Resource Strain (CARDIA)    Difficulty of Paying  Living Expenses: Somewhat hard  Food Insecurity: Food Insecurity Present (11/24/2022)   Received from [facility]   Hunger Vital Sign    Worried About Running Out of Food in the Last Year: Sometimes true    Ran Out of Food in the Last Year: Sometimes true  Transportation Needs: No Transportation Needs (11/24/2022)   Received from [facility] - Transportation    In the past 12 months, has lack of transportation kept you from medical appointments or from getting medications?: No    Lack of Transportation (Non-Medical): No  Physical Activity: Not on file  Stress: Not on file  Social Connections: Not on file   Past Surgical History:  Procedure Laterality Date   ABDOMINAL HYSTERECTOMY     BACK SURGERY     NASAL SINUS SURGERY     Past Surgical History:  Procedure Laterality Date   ABDOMINAL HYSTERECTOMY     BACK SURGERY     NASAL SINUS SURGERY     Past Medical History:  Diagnosis Date   Hypertension    Thyroid disease    BP 134/80   Pulse 76   Ht 5' 3 (1.6 m)   Wt 163 lb 6.4 oz (74.1 kg)   SpO2 95%   BMI 28.95 kg/m   Opioid  Risk Score:   Fall Risk Score:  `1  Depression screen [facility] 2/9     06/11/2023    9:27 AM 02/07/2023    8:55 AM 11/08/2022    9:17 AM 08/09/2022    8:39 AM 05/10/2022    9:15 AM 02/04/2022    8:47 AM 10/08/2021    9:27 AM  Depression screen PHQ 2/9  Decreased Interest 1 1 1 1  0 0 0  Down, Depressed, Hopeless 1 1 1 1  0 0 0  PHQ - 2 Score 2 2 2 2  0 0 0     Review of Systems  Musculoskeletal:  Positive for back pain.  All other systems reviewed and are negative.      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  Here for f/u of chronic pain Also has disc collapse at C4/5 based on xray from 2020.  Also has severe R shoulder tendinopathy  Con't Lidoderm  for now- using on legs  2. Con't Norco 7.5/325 mg up to 3x/day as needed # 90- 2 months refill.    3. Last UDS 02/07/23- so isn't due per lcinic policy.    4. Con't stretching- at least 2x/day.    5. Will see Fidela next 2 months- and then f/u- with me in 4 months-    6. Can ask Pulmonary about nebulizer machine.    7. Add Gabapentin  300 mg nightly x 1 week, then 300 mg 2x/day- if makes too sleepy, take both pills at night.    I spent a total of  20-   minutes on total care today- >50% coordination of care- due to d/w pt and change of gabapentin   And checking for UDS need.

## 2023-06-16 DIAGNOSIS — J4520 Mild intermittent asthma, uncomplicated: Secondary | ICD-10-CM | POA: Diagnosis not present

## 2023-06-16 DIAGNOSIS — R0602 Shortness of breath: Secondary | ICD-10-CM | POA: Diagnosis not present

## 2023-06-16 DIAGNOSIS — K2960 Other gastritis without bleeding: Secondary | ICD-10-CM | POA: Diagnosis not present

## 2023-07-02 ENCOUNTER — Other Ambulatory Visit: Payer: Self-pay

## 2023-07-02 DIAGNOSIS — H16223 Keratoconjunctivitis sicca, not specified as Sjogren's, bilateral: Secondary | ICD-10-CM | POA: Diagnosis not present

## 2023-07-02 DIAGNOSIS — H2513 Age-related nuclear cataract, bilateral: Secondary | ICD-10-CM | POA: Diagnosis not present

## 2023-07-03 ENCOUNTER — Other Ambulatory Visit: Payer: Self-pay

## 2023-07-21 DIAGNOSIS — R079 Chest pain, unspecified: Secondary | ICD-10-CM | POA: Diagnosis not present

## 2023-07-21 DIAGNOSIS — I10 Essential (primary) hypertension: Secondary | ICD-10-CM | POA: Diagnosis not present

## 2023-07-21 DIAGNOSIS — R0602 Shortness of breath: Secondary | ICD-10-CM | POA: Diagnosis not present

## 2023-07-21 DIAGNOSIS — J069 Acute upper respiratory infection, unspecified: Secondary | ICD-10-CM | POA: Diagnosis not present

## 2023-07-21 DIAGNOSIS — M4130 Thoracogenic scoliosis, site unspecified: Secondary | ICD-10-CM | POA: Diagnosis not present

## 2023-07-21 DIAGNOSIS — G8929 Other chronic pain: Secondary | ICD-10-CM | POA: Diagnosis not present

## 2023-07-21 DIAGNOSIS — E663 Overweight: Secondary | ICD-10-CM | POA: Diagnosis not present

## 2023-07-21 DIAGNOSIS — M5442 Lumbago with sciatica, left side: Secondary | ICD-10-CM | POA: Diagnosis not present

## 2023-07-21 DIAGNOSIS — M5441 Lumbago with sciatica, right side: Secondary | ICD-10-CM | POA: Diagnosis not present

## 2023-07-28 ENCOUNTER — Other Ambulatory Visit: Payer: Self-pay | Admitting: Physical Medicine and Rehabilitation

## 2023-07-29 ENCOUNTER — Other Ambulatory Visit: Payer: Self-pay

## 2023-07-29 ENCOUNTER — Telehealth: Payer: Self-pay | Admitting: Registered Nurse

## 2023-07-29 MED ORDER — HYDROCODONE-ACETAMINOPHEN 7.5-325 MG PO TABS
1.0000 | ORAL_TABLET | Freq: Three times a day (TID) | ORAL | 0 refills | Status: DC | PRN
  Filled 2023-07-29 – 2023-08-01 (×2): qty 90, 30d supply, fill #0

## 2023-07-29 NOTE — Telephone Encounter (Signed)
 Dr Berline Chough, sent prescription to pharmacy with a do not fill date.  Will send message to Ms. Truby to call the pharmacy to see if they have her prescription.

## 2023-08-01 ENCOUNTER — Other Ambulatory Visit: Payer: Self-pay

## 2023-08-11 ENCOUNTER — Encounter
Payer: No Typology Code available for payment source | Attending: Physical Medicine and Rehabilitation | Admitting: Registered Nurse

## 2023-08-11 ENCOUNTER — Other Ambulatory Visit: Payer: Self-pay

## 2023-08-11 VITALS — BP 139/76 | HR 65 | Ht 63.0 in | Wt 161.6 lb

## 2023-08-11 DIAGNOSIS — Z5181 Encounter for therapeutic drug level monitoring: Secondary | ICD-10-CM | POA: Insufficient documentation

## 2023-08-11 DIAGNOSIS — G894 Chronic pain syndrome: Secondary | ICD-10-CM | POA: Diagnosis present

## 2023-08-11 DIAGNOSIS — Z79891 Long term (current) use of opiate analgesic: Secondary | ICD-10-CM | POA: Insufficient documentation

## 2023-08-11 DIAGNOSIS — M25562 Pain in left knee: Secondary | ICD-10-CM | POA: Diagnosis not present

## 2023-08-11 DIAGNOSIS — M5416 Radiculopathy, lumbar region: Secondary | ICD-10-CM | POA: Diagnosis not present

## 2023-08-11 DIAGNOSIS — M25561 Pain in right knee: Secondary | ICD-10-CM | POA: Insufficient documentation

## 2023-08-11 DIAGNOSIS — G8929 Other chronic pain: Secondary | ICD-10-CM | POA: Diagnosis present

## 2023-08-11 MED ORDER — HYDROCODONE-ACETAMINOPHEN 7.5-325 MG PO TABS
1.0000 | ORAL_TABLET | Freq: Four times a day (QID) | ORAL | 0 refills | Status: DC | PRN
  Filled 2023-08-11 – 2023-08-29 (×2): qty 90, 23d supply, fill #0

## 2023-08-11 NOTE — Progress Notes (Signed)
 Subjective:    Patient ID: Karla Pineda, female    DOB: November 10, 1945, 78 y.o.   MRN: [id number]  HPI: Karla Pineda is a 78 y.o. female who returns for follow up appointment for chronic pain and medication refill. She states her pain is located in her lower back radiating into her right lower extremity and bilateral knee pain R>L. She rates her pain 6. Her current exercise regime is walking and performing stretching exercises.  [name] Morphine equivalent is 22.50 MME.   UDS ordered today.      Pain Inventory Average Pain 7 Pain Right Now 6 My pain is intermittent, sharp, burning, tingling, and aching  In the last 24 hours, has pain interfered with the following? General activity 7 Relation with others 4 Enjoyment of life 10 What TIME of day is your pain at its worst? morning , evening, and night Sleep (in general) Poor  Pain is worse with: walking, bending, sitting, inactivity, and standing Pain improves with: rest, heat/ice, therapy/exercise, and medication Relief from Meds: 4  No family history on file. Social History   Socioeconomic History   Marital status: Widowed    Spouse name: Not on file   Number of children: Not on file   Years of education: Not on file   Highest education level: Not on file  Occupational History   Not on file  Tobacco Use   Smoking status: Former   Smokeless tobacco: Never  Vaping Use   Vaping status: Never Used  Substance and Sexual Activity   Alcohol use: Not Currently   Drug use: Never   Sexual activity: Not on file  Other Topics Concern   Not on file  Social History Narrative   Not on file   Social Drivers of Health   Financial Resource Strain: Medium Risk (11/24/2022)   Received from [facility]   Overall Financial Resource Strain (CARDIA)    Difficulty of Paying Living Expenses: Somewhat hard  Food Insecurity: Food Insecurity Present (11/24/2022)   Received from [facility]   Hunger  Vital Sign    Worried About Running Out of Food in the Last Year: Sometimes true    Ran Out of Food in the Last Year: Sometimes true  Transportation Needs: No Transportation Needs (11/24/2022)   Received from [facility] - Transportation    In the past 12 months, has lack of transportation kept you from medical appointments or from getting medications?: No    Lack of Transportation (Non-Medical): No  Physical Activity: Not on file  Stress: Not on file  Social Connections: Not on file   Past Surgical History:  Procedure Laterality Date   ABDOMINAL HYSTERECTOMY     BACK SURGERY     NASAL SINUS SURGERY     Past Surgical History:  Procedure Laterality Date   ABDOMINAL HYSTERECTOMY     BACK SURGERY     NASAL SINUS SURGERY     Past Medical History:  Diagnosis Date   Hypertension    Thyroid disease    There were no vitals taken for this visit.  Opioid Risk Score:   Fall Risk Score:  `1  Depression screen [facility] 2/9     06/11/2023    9:27 AM 02/07/2023    8:55 AM 11/08/2022    9:17 AM 08/09/2022    8:39 AM 05/10/2022    9:15 AM 02/04/2022    8:47 AM 10/08/2021    9:27  AM  Depression screen PHQ 2/9  Decreased Interest 1 1 1 1  0 0 0  Down, Depressed, Hopeless 1 1 1 1  0 0 0  PHQ - 2 Score 2 2 2 2  0 0 0    Review of Systems  Constitutional: Negative.   HENT: Negative.    Eyes: Negative.   Respiratory: Negative.    Cardiovascular: Negative.   Gastrointestinal: Negative.   Genitourinary: Negative.   Musculoskeletal:  Positive for arthralgias and back pain.  Skin: Negative.   Allergic/Immunologic: Negative.   Neurological: Negative.   Hematological: Negative.   Psychiatric/Behavioral: Negative.        Objective:   Physical Exam Vitals and nursing note reviewed.  Constitutional:      Appearance: Normal appearance.  Cardiovascular:     Rate and Rhythm: Normal rate and regular rhythm.     Pulses: Normal pulses.     Heart sounds: Normal heart sounds.   Pulmonary:     Effort: Pulmonary effort is normal.     Breath sounds: Normal breath sounds.  Musculoskeletal:     Comments: Normal Muscle Bulk and Muscle Testing Reveals:  Upper Extremities: Full ROM and Muscle Strength 5/5 Bilateral AC Joint Tenderness: R>L Lumbar Paraspinal Tenderness: L-4-L-5 Lower Extremities: Full ROM and Muscle Strength 5/5 Arises from chair slowly Narrow Based  Gait     Skin:    General: Skin is warm and dry.  Neurological:     Mental Status: She is alert and oriented to person, place, and time.  Psychiatric:        Mood and Affect: Mood normal.        Behavior: Behavior normal.         Assessment & Plan:  Idiopathic Scoliosis/ Lumbar/ Lumbar Radiculitis: Continue HEP as Tolerated. Continue to Monitor. 08/11/2023 Myofascial Pain: Continue HEP as Tolerated. Continue to Monitor. 08/11/2023 Polyarthralgia: Continue HEP as Tolerated. Continue to Monitor. 08/11/2023 4. Chronic Pain Syndrome: Refilled: Hydrocodone 7.5/325 one tablet three times a day as needed for pain #90. We will continue the opioid monitoring program, this consists of regular clinic visits, examinations, urine drug screen, pill counts as well as use of [place] Controlled Substance Reporting system. A 12 month History has been reviewed on the [place] Controlled Substance Reporting System on 08/11/2023 5. Bilateral Chronic Knee Pain: Continue HEP as Tolerated. Continue to Monitor.   F/U in 2 months

## 2023-08-14 LAB — TOXASSURE SELECT,+ANTIDEPR,UR

## 2023-08-28 ENCOUNTER — Other Ambulatory Visit: Payer: Self-pay

## 2023-08-29 ENCOUNTER — Other Ambulatory Visit: Payer: Self-pay

## 2023-09-24 ENCOUNTER — Telehealth: Payer: Self-pay | Admitting: Registered Nurse

## 2023-09-24 ENCOUNTER — Other Ambulatory Visit: Payer: Self-pay

## 2023-09-24 ENCOUNTER — Other Ambulatory Visit: Payer: Self-pay | Admitting: Registered Nurse

## 2023-09-24 MED ORDER — HYDROCODONE-ACETAMINOPHEN 7.5-325 MG PO TABS
1.0000 | ORAL_TABLET | Freq: Four times a day (QID) | ORAL | 0 refills | Status: DC | PRN
  Filled 2023-09-24: qty 90, 23d supply, fill #0

## 2023-09-24 NOTE — Telephone Encounter (Signed)
 Patients daughter called in requesting rx for HYDROcodone -acetaminophen  (NORCO) 7.5-325 MG tablet  to be sent , pharmacy informed them there was no rx on file

## 2023-09-24 NOTE — Telephone Encounter (Signed)
 PMP was Reviewed.  Karla Pineda  called her Hydrocodone  prescription was sent to pharmacy, she verbalizes understanding.

## 2023-10-03 DIAGNOSIS — L578 Other skin changes due to chronic exposure to nonionizing radiation: Secondary | ICD-10-CM | POA: Diagnosis not present

## 2023-10-03 DIAGNOSIS — L820 Inflamed seborrheic keratosis: Secondary | ICD-10-CM | POA: Diagnosis not present

## 2023-10-03 DIAGNOSIS — D2239 Melanocytic nevi of other parts of face: Secondary | ICD-10-CM | POA: Diagnosis not present

## 2023-10-03 DIAGNOSIS — D485 Neoplasm of uncertain behavior of skin: Secondary | ICD-10-CM | POA: Diagnosis not present

## 2023-10-08 DIAGNOSIS — F1120 Opioid dependence, uncomplicated: Secondary | ICD-10-CM | POA: Diagnosis not present

## 2023-10-08 DIAGNOSIS — J45909 Unspecified asthma, uncomplicated: Secondary | ICD-10-CM | POA: Diagnosis not present

## 2023-10-08 DIAGNOSIS — M545 Low back pain, unspecified: Secondary | ICD-10-CM | POA: Diagnosis not present

## 2023-10-08 DIAGNOSIS — E7800 Pure hypercholesterolemia, unspecified: Secondary | ICD-10-CM | POA: Diagnosis not present

## 2023-10-08 DIAGNOSIS — M8580 Other specified disorders of bone density and structure, unspecified site: Secondary | ICD-10-CM | POA: Diagnosis not present

## 2023-10-08 DIAGNOSIS — I10 Essential (primary) hypertension: Secondary | ICD-10-CM | POA: Diagnosis not present

## 2023-10-08 DIAGNOSIS — R7303 Prediabetes: Secondary | ICD-10-CM | POA: Diagnosis not present

## 2023-10-10 NOTE — Progress Notes (Signed)
 Subjective:    Patient ID: Karla Pineda, female    DOB: 1945-05-18, 78 y.o.   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  Here for f/u of chronic pain Also has disc collapse at C4/5 based on xray from 2020.  Also has severe R shoulder tendinopathy    Will walk in increments Has to walk uphill on long driveway Cannot walk like used to- doesn't have strength or energy anymore.  Has [employer]-  78 years old- walks him regularly - only 15-20 minute increments-  around [place] acres- gets around property 1x in 15 minutes or so.   Still stretching 2x/day- feels stiff if doesn't= cannot sit 1 hour -gets so tight and stiff.  Lidoderm  doesn't help much-  helps 1-1.5 hours- but not even staying on; so doesn't help.  Uses a cream she got at [facility]- is OTC-  might help 2 hours Across low back- in band across low back- feels SO tight.   Sits on ice pack for a few minutes-  Knows doing more than a lot of 75 yr olds-   Gabapentin - made her so nervous and "crazy in head".   Asking for steroid course. Due to feeling inflamed all over.    Going into surgery this week- for bleeding spot on nose-  Was skin cancer- basal cell cancer-    Only wears back brace 1-2 hours/day-  Pain Inventory Average Pain 7 Pain Right Now 7 My pain is intermittent, sharp, burning, stabbing, tingling, aching, and Numbness  In the last 24 hours, has pain interfered with the following? General activity 10 Relation with others 7 Enjoyment of life 10 What TIME of day is your pain at its worst? morning , daytime, evening, night, and varies Sleep (in general) Poor  Pain is worse with: walking, bending, sitting, standing, and some activites Pain improves with: rest, heat/ice, therapy/exercise, medication, and TENS Relief from Meds: 9  No family history on file. Social History    Socioeconomic History   Marital status: Widowed    Spouse name: Not on file   Number of children: Not on file   Years of education: Not on file   Highest education level: Not on file  Occupational History   Not on file  Tobacco Use   Smoking status: Former   Smokeless tobacco: Never  Vaping Use   Vaping status: Never Used  Substance and Sexual Activity   Alcohol use: Not Currently   Drug use: Never   Sexual activity: Not on file  Other Topics Concern   Not on file  Social History Narrative   Not on file   Social Drivers of Health   Financial Resource Strain: Medium Risk (11/24/2022)   Received from [facility]   Overall Financial Resource Strain (CARDIA)    Difficulty of Paying Living Expenses: Somewhat hard  Food Insecurity: Food Insecurity Present (11/24/2022)   Received from [facility]   Hunger Vital Sign    Worried About Running Out of Food in the Last Year: Sometimes true    Ran Out of Food in the Last Year: Sometimes true  Transportation Needs: No Transportation Needs (11/24/2022)   Received from [facility] Transportation    In the past 12 months, has lack of transportation kept you from  medical appointments or from getting medications?: No    Lack of Transportation (Non-Medical): No  Physical Activity: Not on file  Stress: Not on file  Social Connections: Not on file   Past Surgical History:  Procedure Laterality Date   ABDOMINAL HYSTERECTOMY     BACK SURGERY     NASAL SINUS SURGERY     Past Surgical History:  Procedure Laterality Date   ABDOMINAL HYSTERECTOMY     BACK SURGERY     NASAL SINUS SURGERY     Past Medical History:  Diagnosis Date   Hypertension    Thyroid disease    BP 138/77 (BP Location: Left Arm, Patient Position: Sitting)   Pulse 62   Ht 5\' 3"  (1.6 m)   Wt 163 lb 6.4 oz (74.1 kg)   SpO2 97%   BMI 28.95 kg/m   Opioid Risk Score:   Fall Risk Score:   `1  Depression screen [facility] 2/9     06/11/2023    9:27 AM 02/07/2023    8:55 AM 11/08/2022    9:17 AM 08/09/2022    8:39 AM 05/10/2022    9:15 AM 02/04/2022    8:47 AM 10/08/2021    9:27 AM  Depression screen PHQ 2/9  Decreased Interest 1 1 1 1  0 0 0  Down, Depressed, Hopeless 1 1 1 1  0 0 0  PHQ - 2 Score 2 2 2 2  0 0 0     Review of Systems  All other systems reviewed and are negative.      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  Here for f/u of chronic pain Also has disc collapse at C4/5 based on xray from 2020.  Also has severe R shoulder tendinopathy    See if can get a life alert in a ring that you can wear- that means can wear in shower, etc and more chance to work for your lifestyle considering you live alone.    2. Last steroids 8/24 in injection form- asking for steroid course-  will give steroid course- but DO NOT take until OK"d by Dermatology- because could affect healing rate. Sent in 30 mg/day for 5 days once Ok'd to take.   3. Going to see/make appt for R shoulder- needs to call and get appt   4.  Will continue Norco 7.5 mg/325 mg 3x/day- don't fill before 6/18 and 7/16 Sent to her pharmacy- [facility]- 2 months supply.   5. Can wear back brace- up to 4 hours/day- no more than that.    6.   Made gabapentin  an allergy due to severe vertigo with it  7. UDS done 4/25- not due per clinic policy  8.  FU in 2months- with Emilia Harbour and me in 4 months   I spent a total of 25   minutes on total care today- >50% coordination of care- due to discussion about life alert/falling concerns- steroids; as detailed above.

## 2023-10-13 ENCOUNTER — Encounter
Payer: No Typology Code available for payment source | Attending: Physical Medicine and Rehabilitation | Admitting: Physical Medicine and Rehabilitation

## 2023-10-13 ENCOUNTER — Other Ambulatory Visit: Payer: Self-pay

## 2023-10-13 ENCOUNTER — Encounter: Payer: Self-pay | Admitting: Physical Medicine and Rehabilitation

## 2023-10-13 VITALS — BP 138/77 | HR 62 | Ht 63.0 in | Wt 163.4 lb

## 2023-10-13 DIAGNOSIS — G894 Chronic pain syndrome: Secondary | ICD-10-CM

## 2023-10-13 DIAGNOSIS — M48061 Spinal stenosis, lumbar region without neurogenic claudication: Secondary | ICD-10-CM | POA: Diagnosis not present

## 2023-10-13 DIAGNOSIS — M7541 Impingement syndrome of right shoulder: Secondary | ICD-10-CM | POA: Diagnosis not present

## 2023-10-13 DIAGNOSIS — M25511 Pain in right shoulder: Secondary | ICD-10-CM

## 2023-10-13 MED ORDER — HYDROCODONE-ACETAMINOPHEN 7.5-325 MG PO TABS
1.0000 | ORAL_TABLET | Freq: Three times a day (TID) | ORAL | 0 refills | Status: DC | PRN
  Filled 2023-10-13 – 2023-10-22 (×2): qty 90, 30d supply, fill #0

## 2023-10-13 MED ORDER — HYDROCODONE-ACETAMINOPHEN 7.5-325 MG PO TABS
1.0000 | ORAL_TABLET | Freq: Four times a day (QID) | ORAL | 0 refills | Status: DC | PRN
Start: 2023-10-13 — End: 2023-12-12
  Filled 2023-10-13 – 2023-11-19 (×2): qty 90, 23d supply, fill #0

## 2023-10-13 MED ORDER — PREDNISONE 10 MG PO TABS: 30.0000 mg | ORAL_TABLET | Freq: Every day | ORAL | 0 refills | Status: AC

## 2023-10-13 NOTE — Patient Instructions (Signed)
 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  Here for f/u of chronic pain Also has disc collapse at C4/5 based on xray from 2020.  Also has severe R shoulder tendinopathy    See if can get a life alert in a ring that you can wear- that means can wear in shower, etc and more chance to work for your lifestyle considering you live alone.    2. Last steroids 8/24 in injection form- asking for steroid course-  will give steroid course- but DO NOT take until OK"d by Dermatology- because could affect healing rate. Sent in 30 mg/day for 5 days once Ok'd to take.   3. Going to see/make appt for R shoulder- needs to call and get appt   4.  Will continue Norco 7.5 mg/325 mg 3x/day- don't fill before 6/18 and 7/16 Sent to her pharmacy- [facility]- 2 months supply.   5. Can wear back brace- up to 4 hours/day- no more than that.    6.   Made gabapentin  an allergy due to severe vertigo with it  7. UDS done 4/25- not due per clinic policy  8.  FU in 2months- with Emilia Harbour and me in 4 months

## 2023-10-15 DIAGNOSIS — C44311 Basal cell carcinoma of skin of nose: Secondary | ICD-10-CM | POA: Diagnosis not present

## 2023-10-21 ENCOUNTER — Other Ambulatory Visit: Payer: Self-pay

## 2023-10-22 ENCOUNTER — Other Ambulatory Visit: Payer: Self-pay

## 2023-11-19 ENCOUNTER — Other Ambulatory Visit: Payer: Self-pay

## 2023-11-20 ENCOUNTER — Other Ambulatory Visit: Payer: Self-pay

## 2023-12-02 DIAGNOSIS — R0609 Other forms of dyspnea: Secondary | ICD-10-CM | POA: Diagnosis not present

## 2023-12-10 NOTE — Progress Notes (Signed)
 Subjective:    Patient ID: Karla Pineda, female    DOB: 1946/03/09, 78 y.o.   MRN: [id number]  HPI: Karla Pineda is a 78 y.o. female who returns for follow up appointment for chronic pain and medication refill. She states her pain is located in her right shoulder, lower back radiating into her right lower extremity and bilateral knee pain. She rates her pain 8. Her current exercise regime is walking and performing stretching exercises.  Ms. Peil Morphine equivalent is 22.50 MME.   Oral Swab was Performed today.      Pain Inventory Average Pain 8 Pain Right Now 8 My pain is constant, sharp, burning, stabbing, tingling, and aching  In the last 24 hours, has pain interfered with the following? General activity 10 Relation with others 9 Enjoyment of life 10 What TIME of day is your pain at its worst? morning , daytime, and evening Sleep (in general) Poor  Pain is worse with: walking, bending, sitting, inactivity, standing, and some activites Pain improves with: rest, heat/ice, therapy/exercise, medication, and injections Relief from Meds: 8  No family history on file. Social History   Socioeconomic History   Marital status: Widowed    Spouse name: Not on file   Number of children: Not on file   Years of education: Not on file   Highest education level: Not on file  Occupational History   Not on file  Tobacco Use   Smoking status: Former   Smokeless tobacco: Never  Vaping Use   Vaping status: Never Used  Substance and Sexual Activity   Alcohol use: Not Currently   Drug use: Never   Sexual activity: Not on file  Other Topics Concern   Not on file  Social History Narrative   Not on file   Social Drivers of Health   Financial Resource Strain: Medium Risk (11/24/2022)   Received from [facility]   Overall Financial Resource Strain (CARDIA)    Difficulty of Paying Living Expenses: Somewhat hard  Food Insecurity: Food Insecurity Present  (11/24/2022)   Received from [facility]   Hunger Vital Sign    Worried About Running Out of Food in the Last Year: Sometimes true    Ran Out of Food in the Last Year: Sometimes true  Transportation Needs: No Transportation Needs (11/24/2022)   Received from [facility] - Transportation    In the past 12 months, has lack of transportation kept you from medical appointments or from getting medications?: No    Lack of Transportation (Non-Medical): No  Physical Activity: Not on file  Stress: Not on file  Social Connections: Not on file   Past Surgical History:  Procedure Laterality Date   ABDOMINAL HYSTERECTOMY     BACK SURGERY     NASAL SINUS SURGERY     Past Surgical History:  Procedure Laterality Date   ABDOMINAL HYSTERECTOMY     BACK SURGERY     NASAL SINUS SURGERY     Past Medical History:  Diagnosis Date   Hypertension    Thyroid disease    There were no vitals taken for this visit.  Opioid Risk Score:   Fall Risk Score:  `1  Depression screen [facility] 2/9     10/13/2023    9:32 AM 06/11/2023    9:27 AM 02/07/2023    8:55 AM 11/08/2022    9:17 AM 08/09/2022    8:39 AM 05/10/2022  9:15 AM 02/04/2022    8:47 AM  Depression screen PHQ 2/9  Decreased Interest 0 1 1 1 1  0 0  Down, Depressed, Hopeless 0 1 1 1 1  0 0  PHQ - 2 Score 0 2 2 2 2  0 0    Review of Systems  Musculoskeletal:        Right shoulder pain, right arm pain, right leg pain, left upper leg pain, right buttock pain  All other systems reviewed and are negative.      Objective:   Physical Exam Vitals and nursing note reviewed.  Constitutional:      Appearance: Normal appearance.  Cardiovascular:     Rate and Rhythm: Normal rate and regular rhythm.     Pulses: Normal pulses.     Heart sounds: Normal heart sounds.  Pulmonary:     Effort: Pulmonary effort is normal.     Breath sounds: Normal breath sounds.  Musculoskeletal:     Comments: Normal Muscle Bulk and  Muscle Testing Reveals:  Upper Extremities:Right: Decreased  ROM 30 Degrees and Muscle Strength 4/5 Right: AC Joint Tenderness Left Upper Extremity:  Full ROM and Muscle Strength 5/5 Lumbar Hypersensitivity Right Greater Trochanter Tenderness Lower Extremities : Full ROM and Muscle Strength 5/5 Arises from Table Slowly  Narrow Based Gait     Skin:    General: Skin is warm and dry.  Neurological:     Mental Status: She is alert and oriented to person, place, and time.  Psychiatric:        Mood and Affect: Mood normal.        Behavior: Behavior normal.          Assessment & Plan:   Idiopathic Scoliosis/ Lumbar/ Lumbar Radiculitis: Continue HEP as Tolerated. Continue to Monitor. 12/12/2023 Myofascial Pain: Continue HEP as Tolerated. Continue to Monitor. 12/12/2023 Polyarthralgia: Continue HEP as Tolerated. Continue to Monitor. 12/12/2023 4. Chronic Pain Syndrome: Refilled: Hydrocodone  7.5/325 one tablet three times a day as needed for pain #90. We will continue the opioid monitoring program, this consists of regular clinic visits, examinations, urine drug screen, pill counts as well as use of [place]  Controlled Substance Reporting system. A 12 month History has been reviewed on the   Controlled Substance Reporting System on 12/13/2023 5. Bilateral Chronic Knee Pain: Continue HEP as Tolerated. Continue to Monitor. 12/12/2023 6. Right Shoulder Pain: She reports she will be scheduling an appointment with Ortho. Continue to Monitor.  F/U in 2 months

## 2023-12-12 ENCOUNTER — Encounter: Attending: Physical Medicine and Rehabilitation | Admitting: Registered Nurse

## 2023-12-12 ENCOUNTER — Other Ambulatory Visit: Payer: Self-pay

## 2023-12-12 ENCOUNTER — Encounter: Payer: Self-pay | Admitting: Registered Nurse

## 2023-12-12 VITALS — BP 137/82 | HR 81 | Ht 63.0 in | Wt 169.0 lb

## 2023-12-12 DIAGNOSIS — G8929 Other chronic pain: Secondary | ICD-10-CM | POA: Diagnosis present

## 2023-12-12 DIAGNOSIS — Z5181 Encounter for therapeutic drug level monitoring: Secondary | ICD-10-CM | POA: Diagnosis present

## 2023-12-12 DIAGNOSIS — M25562 Pain in left knee: Secondary | ICD-10-CM | POA: Insufficient documentation

## 2023-12-12 DIAGNOSIS — Z79891 Long term (current) use of opiate analgesic: Secondary | ICD-10-CM | POA: Diagnosis present

## 2023-12-12 DIAGNOSIS — M5416 Radiculopathy, lumbar region: Secondary | ICD-10-CM | POA: Diagnosis present

## 2023-12-12 DIAGNOSIS — G894 Chronic pain syndrome: Secondary | ICD-10-CM | POA: Diagnosis present

## 2023-12-12 DIAGNOSIS — M25511 Pain in right shoulder: Secondary | ICD-10-CM | POA: Insufficient documentation

## 2023-12-12 DIAGNOSIS — M25561 Pain in right knee: Secondary | ICD-10-CM | POA: Insufficient documentation

## 2023-12-12 MED ORDER — HYDROCODONE-ACETAMINOPHEN 7.5-325 MG PO TABS
1.0000 | ORAL_TABLET | Freq: Four times a day (QID) | ORAL | 0 refills | Status: DC | PRN
  Filled 2023-12-12 – 2023-12-17 (×2): qty 90, 23d supply, fill #0

## 2023-12-12 NOTE — Patient Instructions (Signed)
 Call office on 01/06/2024: Regarding September Prescription

## 2023-12-17 ENCOUNTER — Other Ambulatory Visit: Payer: Self-pay

## 2023-12-17 LAB — DRUG TOX MONITOR 1 W/CONF, ORAL FLD
Amphetamines: NEGATIVE ng/mL (ref <10)
Barbiturates: NEGATIVE ng/mL (ref <10)
Benzodiazepines: NEGATIVE ng/mL (ref <0.50)
Buprenorphine: NEGATIVE ng/mL (ref <0.10)
Cocaine: NEGATIVE ng/mL (ref <5.0)
Codeine: NEGATIVE ng/mL (ref <2.5)
Dihydrocodeine: 10.4 ng/mL — ABNORMAL HIGH (ref <2.5)
Fentanyl: NEGATIVE ng/mL (ref <0.10)
Heroin Metabolite: NEGATIVE ng/mL (ref <1.0)
Hydrocodone: 91.5 ng/mL — ABNORMAL HIGH (ref <2.5)
Hydromorphone: NEGATIVE ng/mL (ref <2.5)
MARIJUANA: NEGATIVE ng/mL (ref <2.5)
MDMA: NEGATIVE ng/mL (ref <10)
Meprobamate: NEGATIVE ng/mL (ref <2.5)
Methadone: NEGATIVE ng/mL (ref <5.0)
Morphine: NEGATIVE ng/mL (ref <2.5)
Nicotine Metabolite: NEGATIVE ng/mL (ref <5.0)
Norhydrocodone: 7.1 ng/mL — ABNORMAL HIGH (ref <2.5)
Noroxycodone: NEGATIVE ng/mL (ref <2.5)
Opiates: POSITIVE ng/mL — AB (ref <2.5)
Oxycodone: NEGATIVE ng/mL (ref <2.5)
Oxymorphone: NEGATIVE ng/mL (ref <2.5)
Phencyclidine: NEGATIVE ng/mL (ref <10)
Tapentadol: NEGATIVE ng/mL (ref <5.0)
Tramadol: NEGATIVE ng/mL (ref <5.0)
Zolpidem: NEGATIVE ng/mL (ref <5.0)

## 2023-12-17 LAB — DRUG TOX ALC METAB W/CON, ORAL FLD: Alcohol Metabolite: NEGATIVE ng/mL (ref <25)

## 2024-01-06 ENCOUNTER — Telehealth: Payer: Self-pay

## 2024-01-06 NOTE — Telephone Encounter (Signed)
 Patient currently has #26 Hydrocodone  7.5-325 mg tabs on hand. She is concerned about the next  release date falling on a Saturday. Patient wanted to know if she could pick up the Rx on the Thursday or Friday. [facility]).

## 2024-01-07 ENCOUNTER — Other Ambulatory Visit: Payer: Self-pay

## 2024-01-07 ENCOUNTER — Telehealth: Payer: Self-pay | Admitting: Registered Nurse

## 2024-01-07 MED ORDER — HYDROCODONE-ACETAMINOPHEN 7.5-325 MG PO TABS
1.0000 | ORAL_TABLET | Freq: Three times a day (TID) | ORAL | 0 refills | Status: DC | PRN
  Filled 2024-01-07 – 2024-01-14 (×4): qty 90, 30d supply, fill #0

## 2024-01-07 NOTE — Telephone Encounter (Signed)
PMP was Reviewed.  Hydrocodone e-scribed to pharmacy.

## 2024-01-14 ENCOUNTER — Other Ambulatory Visit: Payer: Self-pay

## 2024-01-22 DIAGNOSIS — E663 Overweight: Secondary | ICD-10-CM | POA: Diagnosis not present

## 2024-01-22 DIAGNOSIS — R9431 Abnormal electrocardiogram [ECG] [EKG]: Secondary | ICD-10-CM | POA: Diagnosis not present

## 2024-01-22 DIAGNOSIS — R079 Chest pain, unspecified: Secondary | ICD-10-CM | POA: Diagnosis not present

## 2024-01-22 DIAGNOSIS — M4130 Thoracogenic scoliosis, site unspecified: Secondary | ICD-10-CM | POA: Diagnosis not present

## 2024-01-22 DIAGNOSIS — J069 Acute upper respiratory infection, unspecified: Secondary | ICD-10-CM | POA: Diagnosis not present

## 2024-01-22 DIAGNOSIS — I10 Essential (primary) hypertension: Secondary | ICD-10-CM | POA: Diagnosis not present

## 2024-01-22 DIAGNOSIS — R0602 Shortness of breath: Secondary | ICD-10-CM | POA: Diagnosis not present

## 2024-02-09 ENCOUNTER — Other Ambulatory Visit: Payer: Self-pay | Admitting: Registered Nurse

## 2024-02-09 ENCOUNTER — Other Ambulatory Visit: Payer: Self-pay

## 2024-02-09 NOTE — Telephone Encounter (Signed)
 Patient called and said that her pharmacy wants prescription 2 days prior to filling so they can make sure they have it in stock and she states Dr. Cornelio said to call office 3 days prior for it. Patient states she has an appt on Friday 10/10

## 2024-02-10 ENCOUNTER — Other Ambulatory Visit: Payer: Self-pay

## 2024-02-10 ENCOUNTER — Telehealth: Payer: Self-pay | Admitting: Registered Nurse

## 2024-02-10 DIAGNOSIS — G894 Chronic pain syndrome: Secondary | ICD-10-CM

## 2024-02-10 DIAGNOSIS — G8929 Other chronic pain: Secondary | ICD-10-CM

## 2024-02-10 MED ORDER — HYDROCODONE-ACETAMINOPHEN 7.5-325 MG PO TABS
1.0000 | ORAL_TABLET | Freq: Three times a day (TID) | ORAL | 0 refills | Status: DC | PRN
  Filled 2024-02-10 (×2): qty 90, 30d supply, fill #0

## 2024-02-10 NOTE — Telephone Encounter (Signed)
 PMP was Reviewed.  Hydrocodone e-scribed to pharmacy. Karla Pineda is aware via My Chart  message.

## 2024-02-12 ENCOUNTER — Other Ambulatory Visit: Payer: Self-pay

## 2024-02-13 ENCOUNTER — Encounter: Payer: Self-pay | Admitting: Physical Medicine and Rehabilitation

## 2024-02-13 ENCOUNTER — Other Ambulatory Visit: Payer: Self-pay

## 2024-02-13 ENCOUNTER — Encounter: Attending: Physical Medicine and Rehabilitation | Admitting: Physical Medicine and Rehabilitation

## 2024-02-13 DIAGNOSIS — M25511 Pain in right shoulder: Secondary | ICD-10-CM | POA: Insufficient documentation

## 2024-02-13 DIAGNOSIS — M25562 Pain in left knee: Secondary | ICD-10-CM | POA: Diagnosis present

## 2024-02-13 DIAGNOSIS — M25561 Pain in right knee: Secondary | ICD-10-CM | POA: Insufficient documentation

## 2024-02-13 DIAGNOSIS — G8929 Other chronic pain: Secondary | ICD-10-CM | POA: Diagnosis present

## 2024-02-13 DIAGNOSIS — G894 Chronic pain syndrome: Secondary | ICD-10-CM | POA: Diagnosis present

## 2024-02-13 MED ORDER — HYDROCODONE-ACETAMINOPHEN 7.5-325 MG PO TABS
1.0000 | ORAL_TABLET | Freq: Three times a day (TID) | ORAL | 0 refills | Status: DC | PRN
  Filled 2024-02-13: qty 90, 30d supply, fill #0
  Filled 2024-04-11: qty 40, 14d supply, fill #0
  Filled 2024-04-11: qty 90, 30d supply, fill #0
  Filled 2024-04-11: qty 50, 16d supply, fill #0

## 2024-02-13 MED ORDER — HYDROCODONE-ACETAMINOPHEN 7.5-325 MG PO TABS
1.0000 | ORAL_TABLET | Freq: Four times a day (QID) | ORAL | 0 refills | Status: DC | PRN
  Filled 2024-02-13 – 2024-03-12 (×2): qty 90, 23d supply, fill #0

## 2024-02-13 NOTE — Progress Notes (Signed)
 Subjective:    Patient ID: Karla Pineda, female    DOB: 1945/12/03, 78 y.o.   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  Here for f/u of chronic pain Also has disc collapse at C4/5 based on xray from 2020.  Also has severe R shoulder tendinopathy   Dealing with  R shoulder pain! CROPS UP EVERY SO OFTEN-  but doesn't know something to set it off.  Doesn't take much to set it off.  Came back- the pain so bad  Hasn't seen Orthopedics- been bad for 3 months  Is R handed, everything she does- combs hair, walks dog,   Only sleeps a little-  does ice, heat, etc and HEP-  Looking for R shoulder brace.  Saw Dr Barbarann in past- Ortho. Retired last March   Feel something feels shredded in back of triceps and to just below elbow.  Voltaren gel only gives relief for ~ 2 hours.   Lives alone- cannot take care of self very well- interfering.  Last shoulder trauma?? [place] got steroids from me 4 months ago-  but when finished steroids and had really overdid, so got hurt more.    Pt is making it because hs no choice   Pain Inventory Average Pain 7 Pain Right Now 8 My pain is intermittent, sharp, burning, and aching  In the last 24 hours, has pain interfered with the following? General activity 9 Relation with others 9 Enjoyment of life 8 What TIME of day is your pain at its worst? morning , daytime, and night Sleep (in general) Poor  Pain is worse with: bending, sitting, standing, and some activites Pain improves with: rest, heat/ice, medication, TENS, and exercise Relief from Meds: 9  History reviewed. No pertinent family history. Social History   Socioeconomic History   Marital status: Widowed    Spouse name: Not on file   Number of children: Not on file   Years of education: Not on file   Highest education level: Not on file  Occupational  History   Not on file  Tobacco Use   Smoking status: Former   Smokeless tobacco: Never  Vaping Use   Vaping status: Never Used  Substance and Sexual Activity   Alcohol use: Not Currently   Drug use: Never   Sexual activity: Not on file  Other Topics Concern   Not on file  Social History Narrative   Not on file   Social Drivers of Health   Financial Resource Strain: Medium Risk (11/24/2022)   Received from [facility]   Overall Financial Resource Strain (CARDIA)    Difficulty of Paying Living Expenses: Somewhat hard  Food Insecurity: Food Insecurity Present (11/24/2022)   Received from [facility]   Hunger Vital Sign    Within the past 12 months, you worried that your food would run out before you got the money to buy more.: Sometimes true    Within the past 12 months, the food you bought just didn't last and you didn't have money to get more.: Sometimes true  Transportation Needs: No Transportation Needs (11/24/2022)   Received from [facility] - Transportation    In the past 12 months, has lack of transportation kept you from medical appointments or from getting medications?: No  Lack of Transportation (Non-Medical): No  Physical Activity: Not on file  Stress: Not on file  Social Connections: Not on file   Past Surgical History:  Procedure Laterality Date   ABDOMINAL HYSTERECTOMY     BACK SURGERY     NASAL SINUS SURGERY     Past Surgical History:  Procedure Laterality Date   ABDOMINAL HYSTERECTOMY     BACK SURGERY     NASAL SINUS SURGERY     Past Medical History:  Diagnosis Date   Hypertension    Thyroid disease    BP 121/75   Pulse 67   Ht 5' 3 (1.6 m)   Wt 163 lb (73.9 kg)   SpO2 100%   BMI 28.87 kg/m   Opioid Risk Score:   Fall Risk Score:  `1  Depression screen [facility] 2/9     02/13/2024   10:47 AM 12/12/2023   10:32 AM 10/13/2023    9:32 AM 06/11/2023    9:27 AM 02/07/2023    8:55 AM  11/08/2022    9:17 AM 08/09/2022    8:39 AM  Depression screen PHQ 2/9  Decreased Interest 1 1 0 1 1 1 1   Down, Depressed, Hopeless 1 1 0 1 1 1 1   PHQ - 2 Score 2 2 0 2 2 2 2     Review of Systems  Musculoskeletal:        Right shoulder / arm pain, left upper leg/thigh pain, right buttock pain,   All other systems reviewed and are negative.      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  Here for f/u of chronic pain Also has disc collapse at C4/5 based on xray from 2020.  Also has severe R shoulder tendinopathy  Wants to wait on Ortho referral for shoulder surgery.  We went over the doctors at [facility], [facility], [facility] and [facility]- pt still wants to wait since her doctor retired. Will check with daughter.  -finally got pt to agree to referral- was worried would interfere in taking care of daughter after surgery    2. Con't Norco 7.5/325 up to 3x/day as needed #90- sent in 2 refills- just picked up today.   3.  Con't Norco as directed  4. F/U in 2 months with Fidela and 4 months with me.    I spent a total of  21  minutes on total care today- >50% coordination of care- due to d/w pt about her options for shoulder pain- cannot do more oral steroids- but can send to Ortho- took a lot of encouragement- but placed referral with pt's consent

## 2024-02-13 NOTE — Patient Instructions (Signed)
 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  Here for f/u of chronic pain Also has disc collapse at C4/5 based on xray from 2020.  Also has severe R shoulder tendinopathy  Wants to wait on Ortho referral for shoulder surgery.  We went over the doctors at [facility], [facility], [facility] and [facility]- pt still wants to wait since her doctor retired. Will check with daughter.  -finally got pt to agree to referral- was worried would interfere in taking care of daughter after surgery    2. Con't Norco 7.5/325 up to 3x/day as needed #90- sent in 2 refills- just picked up today.   3.  Con't Norco as directed  4. F/U in 2 months with Fidela and 4 months with me.

## 2024-02-23 ENCOUNTER — Other Ambulatory Visit: Payer: Self-pay

## 2024-02-23 ENCOUNTER — Encounter: Payer: Self-pay | Admitting: Surgical

## 2024-02-23 ENCOUNTER — Ambulatory Visit: Admitting: Surgical

## 2024-02-23 DIAGNOSIS — S46811A Strain of other muscles, fascia and tendons at shoulder and upper arm level, right arm, initial encounter: Secondary | ICD-10-CM

## 2024-02-23 DIAGNOSIS — M7521 Bicipital tendinitis, right shoulder: Secondary | ICD-10-CM | POA: Diagnosis not present

## 2024-02-23 DIAGNOSIS — G8929 Other chronic pain: Secondary | ICD-10-CM

## 2024-02-23 DIAGNOSIS — M25511 Pain in right shoulder: Secondary | ICD-10-CM | POA: Diagnosis not present

## 2024-02-24 ENCOUNTER — Encounter: Payer: Self-pay | Admitting: Surgical

## 2024-02-24 DIAGNOSIS — M7521 Bicipital tendinitis, right shoulder: Secondary | ICD-10-CM | POA: Diagnosis not present

## 2024-02-24 MED ORDER — BUPIVACAINE HCL 0.25 % IJ SOLN
9.0000 mL | INTRAMUSCULAR | Status: AC | PRN
  Administered 2024-02-24: 9 mL via INTRA_ARTICULAR

## 2024-02-24 MED ORDER — TRIAMCINOLONE ACETONIDE 40 MG/ML IJ SUSP
40.0000 mg | INTRAMUSCULAR | Status: AC | PRN
  Administered 2024-02-24: 40 mg via INTRA_ARTICULAR

## 2024-02-24 MED ORDER — LIDOCAINE HCL 1 % IJ SOLN
5.0000 mL | INTRAMUSCULAR | Status: AC | PRN
  Administered 2024-02-24: 5 mL

## 2024-02-24 NOTE — Progress Notes (Signed)
 Office Visit Note   Patient: Karla Pineda           Date of Birth: [birth year]           MRN: [id number] Visit Date: 02/23/2024 Requested by: Cornelio Bouchard, MD [phone] [place],  [place] [address] PCP: Montey Lot, PA-C  Subjective: Chief Complaint  Patient presents with   Right Shoulder - Pain    HPI: Karla Pineda is a 78 y.o. female who presents to the office reporting chronic right shoulder pain.  Patient has history of right shoulder pain that has been ongoing for several years.  She has seen Dr. Barbarann previously and had discussed possibility of surgery with him but wanted to try injection so she had right subacromial injection administered on 02/21/2023 that gave her almost a year of relief.  At this point, she is here today complaining of increased pain over the last 90 days with difficulty sleeping at night in particular.  She has to sleep in a recliner.  She is right-hand dominant.  She describes lateral pain with radiation to the elbow.  No scapular pain but does have occasional trapezius pain.  She is retired.  Has tried ice, heat, lidocaine  patch.  She is also in pain management and takes hydrocodone  3 times a day as prescribed by Dr. Lovvorn.  She has a [place] terrier which she walks every day and he pulls on her arm which may have exacerbated her symptoms.  No left-sided symptoms.  Occasional numbness and tingling in the arm.  Does have history of prior right shoulder surgery in [place] about 15 years ago but she cannot recall what this was exactly.  She lives alone as her husband passed away about a decade ago..                ROS: All systems reviewed are negative as they relate to the chief complaint within the history of present illness.  Patient denies fevers or chills.  Assessment & Plan: Visit Diagnoses:  1. Biceps tendinitis of right upper extremity   2. Chronic right shoulder pain   3. Partial tear of right subscapularis tendon, initial  encounter     Plan: Impression is 78 year old female who presents for evaluation of right shoulder pain.  She has prior MRI of the right shoulder about 2 years ago demonstrating tendinopathy of the supraspinatus as well as the subscap tendons.  To my review, it appears that the long head of the bicep tendon is medially subluxing from the bicipital groove likely due to the subscap pathology.  She does have subscap weakness on exam today.  However, she lives alone and wants to avoid any sort of surgical intervention.  She would like to try another injection to see if this will help.  We administered ultrasound-guided glenohumeral injection today in the clinic and patient tolerated procedure well without complication.  We will see how this does for her.  If no improvement, could consider AC joint injection to see if this will help more prior to discussing surgical intervention which would likely be bicep tenodesis and rotator cuff repair but this would be somewhat difficult for her to recover from due to the fact that she lives alone and has a strong-willed dog to take care of  Follow-Up Instructions: No follow-ups on file.   Orders:  Orders Placed This Encounter  Procedures   XR Shoulder Right   US  Guided Needle Placement - No Linked  Charges   No orders of the defined types were placed in this encounter.     Procedures: Large Joint Inj: R glenohumeral on 02/24/2024 3:07 PM Indications: pain and diagnostic evaluation Details: 22 G 3.5 in needle, ultrasound-guided posterior approach Medications: 5 mL lidocaine  1 %; 9 mL bupivacaine  0.25 %; 40 mg triamcinolone acetonide 40 MG/ML Outcome: tolerated well, no immediate complications Procedure, treatment alternatives, risks and benefits explained, specific risks discussed. Consent was given by the patient. Immediately prior to procedure a time out was called to verify the correct patient, procedure, equipment, support staff and site/side marked as  required. Patient was prepped and draped in the usual sterile fashion.       Clinical Data: No additional findings.  Objective: Vital Signs: There were no vitals taken for this visit.  Physical Exam:  Constitutional: Patient appears well-developed HEENT:  Head: Normocephalic Eyes:EOM are normal Neck: Normal range of motion Cardiovascular: Normal rate Pulmonary/chest: Effort normal Neurologic: Patient is alert Skin: Skin is warm Psychiatric: Patient has normal mood and affect  Ortho Exam: Ortho ortho exam demonstrates left shoulder with 70 degrees X rotation, 80 degrees abduction, 150 degrees forward elevation which is compared with the right shoulder with 85 degrees external rotation, 95 degrees abduction, 170 degrees forward elevation passively.  No obvious deformity to inspection of the shoulder.  Axillary nerve is intact with deltoid firing.  2+ radial pulse of the bilateral upper extremities.  Intact EPL, FPL, finger abduction, pronation/supination, bicep, tricep, deltoid.  Rotator cuff strength testing demonstrates 4/5 subscap strength.  External rotation and infra/supra strength rated 5/5.  Tender with palpation over bicipital groove moderately but also moderate AC joint tenderness.  Positive crossarm adduction test.  Positive Neer's and Hawkins impingement signs.  No cellulitis or skin changes noted.  Mild crepitus noted with passive motion of the shoulder.  Negative external rotation lag sign.  Negative Hornblower sign.  Negative Spurling sign.  Negative Lhermitte sign.  No pain reproduced with cervical spine range of motion.  Specialty Comments:  No specialty comments available.  Imaging: No results found.   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 No pertinent family history.  Past Surgical History:  Procedure Laterality Date   ABDOMINAL HYSTERECTOMY     BACK SURGERY     NASAL SINUS SURGERY     Social History   Occupational History   Not on file  Tobacco Use   Smoking status: Former   Smokeless tobacco: Never  Vaping Use   Vaping status: Never Used  Substance and Sexual Activity   Alcohol use: Not Currently   Drug use: Never   Sexual activity: Not on file

## 2024-03-08 ENCOUNTER — Encounter: Payer: Self-pay | Admitting: Radiology

## 2024-03-09 ENCOUNTER — Other Ambulatory Visit: Payer: Self-pay

## 2024-03-12 ENCOUNTER — Other Ambulatory Visit: Payer: Self-pay

## 2024-03-19 ENCOUNTER — Ambulatory Visit: Admitting: Surgical

## 2024-04-07 ENCOUNTER — Other Ambulatory Visit: Payer: Self-pay

## 2024-04-08 ENCOUNTER — Ambulatory Visit: Admitting: Surgical

## 2024-04-08 DIAGNOSIS — M7541 Impingement syndrome of right shoulder: Secondary | ICD-10-CM

## 2024-04-08 DIAGNOSIS — M7521 Bicipital tendinitis, right shoulder: Secondary | ICD-10-CM

## 2024-04-11 ENCOUNTER — Other Ambulatory Visit: Payer: Self-pay

## 2024-04-11 MED ORDER — METHYLPREDNISOLONE ACETATE 40 MG/ML IJ SUSP
40.0000 mg | INTRAMUSCULAR | Status: AC | PRN
  Administered 2024-04-08: 40 mg via INTRA_ARTICULAR

## 2024-04-11 MED ORDER — BUPIVACAINE HCL 0.5 % IJ SOLN
9.0000 mL | INTRAMUSCULAR | Status: AC | PRN
  Administered 2024-04-08: 9 mL via INTRA_ARTICULAR

## 2024-04-11 NOTE — Progress Notes (Signed)
 Follow-up Office Visit Note   Patient: Karla Pineda           Date of Birth: 1945-08-10           MRN: [id number] Visit Date: 04/08/2024 Requested by: Montey Lot, PA-C [id number] [place],  [place] [id number] PCP: Montey Lot, PA-C  Subjective: Chief Complaint  Patient presents with   Right Shoulder - Pain    HPI: Karla Pineda is a 78 y.o. female who returns to the office for follow-up visit.    Plan at last visit was: Impression is 78 year old female who presents for evaluation of right shoulder pain. She has prior MRI of the right shoulder about 2 years ago demonstrating tendinopathy of the supraspinatus as well as the subscap tendons. To my review, it appears that the long head of the bicep tendon is medially subluxing from the bicipital groove likely due to the subscap pathology. She does have subscap weakness on exam today. However, she lives alone and wants to avoid any sort of surgical intervention. She would like to try another injection to see if this will help. We administered ultrasound-guided glenohumeral injection today in the clinic and patient tolerated procedure well without complication. We will see how this does for her. If no improvement, could consider AC joint injection to see if this will help more prior to discussing surgical intervention which would likely be bicep tenodesis and rotator cuff repair but this would be somewhat difficult for her to recover from due to the fact that she lives alone and has a strong-willed dog to take care of   Since then, patient notes glenohumeral injection helped by about 50% but she feels like she did too much after she started feeling better and her pain has returned in full force.  She lives alone and has to do a lot of things for herself and has a high-energy dog that she has to walk as well.  She wants to avoid surgery still.  She has had prior subacromial injection with Dr. Barbarann about 1 year ago that gave her great  relief of her current shoulder pain.              ROS: All systems reviewed are negative as they relate to the chief complaint within the history of present illness.  Patient denies fevers or chills.  Assessment & Plan: Visit Diagnoses: No diagnosis found.  Plan: Karla Pineda is a 78 y.o. female who returns to the office for follow-up visit.  Plan from last visit was noted above in HPI.  They now return with 50% improvement from glenohumeral injection.  She was feeling a lot better and then started to do her normal activity and pain returned.  She still wants to avoid surgery.  Think the best option at this point would be trying a subacromial injection which gave her near 100% relief about 1 year ago with Dr. Barbarann.  If this does not give her similar relief, she may need to consider discussing surgical intervention with Dr. [place].  Subacromial injection administered today and patient tolerated procedure well without complication.  Follow-up as needed if pain does not improve.  Follow-Up Instructions: No follow-ups on file.   Orders:  No orders of the defined types were placed in this encounter.  No orders of the defined types were placed in this encounter.     Procedures: Large Joint Inj: R subacromial bursa on 04/08/2024 12:30 PM Indications: diagnostic evaluation and pain Details:  22 G 1.5 in needle, posterior approach  Arthrogram: No  Medications: 9 mL bupivacaine  0.5 %; 40 mg methylPREDNISolone  acetate 40 MG/ML Outcome: tolerated well, no immediate complications Procedure, treatment alternatives, risks and benefits explained, specific risks discussed. Consent was given by the patient. Immediately prior to procedure a time out was called to verify the correct patient, procedure, equipment, support staff and site/side marked as required. Patient was prepped and draped in the usual sterile fashion.       Clinical Data: No additional findings.  Objective: Vital Signs: There were  no vitals taken for this visit.  Physical Exam:  Constitutional: Patient appears well-developed HEENT:  Head: Normocephalic Eyes:EOM are normal Neck: Normal range of motion Cardiovascular: Normal rate Pulmonary/chest: Effort normal Neurologic: Patient is alert Skin: Skin is warm Psychiatric: Patient has normal mood and affect  Ortho Exam: Ortho exam demonstrates right shoulder with 70 degrees X rotation, 90 degrees abduction, 160 degrees forward elevation.  5 -/5 supra, infra, subscap strength.  Axillary nerve intact with deltoid firing.  Positive tenderness over the bicipital groove.  No tenderness over the [facility] joint today.  Negative external rotation lag sign.  Negative Hornblower sign.  Specialty Comments:  No specialty comments available.  Imaging: No results found.   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  Past Surgical History:  Procedure Laterality Date   ABDOMINAL HYSTERECTOMY     BACK SURGERY     NASAL SINUS SURGERY     Social History   Occupational History   Not on file  Tobacco Use   Smoking status: Former   Smokeless tobacco: Never  Vaping Use   Vaping status: Never Used  Substance and  Sexual Activity   Alcohol use: Not Currently   Drug use: Never   Sexual activity: Not on file

## 2024-04-12 ENCOUNTER — Other Ambulatory Visit: Payer: Self-pay

## 2024-04-13 ENCOUNTER — Other Ambulatory Visit: Payer: Self-pay

## 2024-04-14 ENCOUNTER — Other Ambulatory Visit: Payer: Self-pay

## 2024-04-15 ENCOUNTER — Other Ambulatory Visit: Payer: Self-pay

## 2024-04-16 ENCOUNTER — Other Ambulatory Visit: Payer: Self-pay

## 2024-04-16 ENCOUNTER — Encounter: Payer: Self-pay | Admitting: Registered Nurse

## 2024-04-16 ENCOUNTER — Encounter: Attending: Physical Medicine and Rehabilitation | Admitting: Registered Nurse

## 2024-04-16 VITALS — BP 144/77 | HR 88 | Ht 63.0 in | Wt 164.8 lb

## 2024-04-16 DIAGNOSIS — M5416 Radiculopathy, lumbar region: Secondary | ICD-10-CM | POA: Diagnosis not present

## 2024-04-16 DIAGNOSIS — G894 Chronic pain syndrome: Secondary | ICD-10-CM | POA: Diagnosis present

## 2024-04-16 DIAGNOSIS — Z79891 Long term (current) use of opiate analgesic: Secondary | ICD-10-CM | POA: Diagnosis not present

## 2024-04-16 DIAGNOSIS — G8929 Other chronic pain: Secondary | ICD-10-CM | POA: Insufficient documentation

## 2024-04-16 DIAGNOSIS — Z5181 Encounter for therapeutic drug level monitoring: Secondary | ICD-10-CM

## 2024-04-16 DIAGNOSIS — M7061 Trochanteric bursitis, right hip: Secondary | ICD-10-CM | POA: Diagnosis present

## 2024-04-16 DIAGNOSIS — M25511 Pain in right shoulder: Secondary | ICD-10-CM | POA: Insufficient documentation

## 2024-04-16 DIAGNOSIS — M25561 Pain in right knee: Secondary | ICD-10-CM | POA: Diagnosis present

## 2024-04-16 DIAGNOSIS — M7062 Trochanteric bursitis, left hip: Secondary | ICD-10-CM | POA: Insufficient documentation

## 2024-04-16 MED ORDER — HYDROCODONE-ACETAMINOPHEN 7.5-325 MG PO TABS
1.0000 | ORAL_TABLET | Freq: Three times a day (TID) | ORAL | 0 refills | Status: DC | PRN
  Filled 2024-04-16: qty 90, 30d supply, fill #0
  Filled 2024-05-12: qty 60, 20d supply, fill #0
  Filled 2024-05-12: qty 30, 10d supply, fill #0

## 2024-04-16 NOTE — Progress Notes (Signed)
 Subjective:    Patient ID: Karla Pineda, female    DOB: 1945/11/02, 78 y.o.   MRN: [id number]  HPI: Karla Pineda is a 78 y.o. female who returns for follow up appointment for chronic pain and medication refill. She states her pain is located in her right shoulder, ortho following. Also reports lower back pain radiating into her right lower extremity, bilateral hips and right knee pain. She  rates her pain 7. Her current exercise regime is walking and performing stretching exercises.  Karla Pineda Morphine equivalent is 22.50 MME.   Oral Swab was performed today.    Pain Inventory Average Pain 7 Pain Right Now 7 My pain is sharp, burning, stabbing, and aching  In the last 24 hours, has pain interfered with the following? General activity 8 Relation with others 8 Enjoyment of life 8 What TIME of day is your pain at its worst? morning , evening, and night Sleep (in general) Poor  Pain is worse with: walking, sitting, inactivity, standing, and some activites Pain improves with: heat/ice, therapy/exercise, medication, TENS, and injections Relief from Meds: 7  No family history on file. Social History   Socioeconomic History   Marital status: Widowed    Spouse name: Not on file   Number of children: Not on file   Years of education: Not on file   Highest education level: Not on file  Occupational History   Not on file  Tobacco Use   Smoking status: Former   Smokeless tobacco: Never  Vaping Use   Vaping status: Never Used  Substance and Sexual Activity   Alcohol use: Not Currently   Drug use: Never   Sexual activity: Not on file  Other Topics Concern   Not on file  Social History Narrative   Not on file   Social Drivers of Health   Tobacco Use: Medium Risk (04/16/2024)   Patient History    Smoking Tobacco Use: Former    Smokeless Tobacco Use: Never    Passive Exposure: Not on file  Financial Resource Strain: Medium Risk (11/24/2022)   Received from [facility]   Overall Financial Resource Strain (CARDIA)    Difficulty of Paying Living Expenses: Somewhat hard  Food Insecurity: Food Insecurity Present (11/24/2022)   Received from [facility]   Epic    Within the past 12 months, you worried that your food would run out before you got the money to buy more.: Sometimes true    Within the past 12 months, the food you bought just didn't last and you didn't have money to get more.: Sometimes true  Transportation Needs: No Transportation Needs (11/24/2022)   Received from [facility] - Transportation    In the past 12 months, has lack of transportation kept you from medical appointments or from getting medications?: No    Lack of Transportation (Non-Medical): No  Physical Activity: Not on file  Stress: Not on file  Social Connections: Not on file  Depression (EYV7-0): Low Risk (02/13/2024)   Depression (PHQ2-9)    PHQ-2 Score: 2  Alcohol Screen: Not on file  Housing: Low Risk  (11/24/2022)   Received from [facility]   Epic    In the last 12 months, was there a time when you were not able to pay the mortgage or rent on time?: No    In the past 12 months, how many times have you moved where you  were living?: 0    At any time in the past 12 months, were you homeless or living in a shelter (including now)?: No  Utilities: Not At Risk (11/24/2022)   Received from [facility]    Threatened with loss of utilities: No  Health Literacy: Not on file   Past Surgical History:  Procedure Laterality Date   ABDOMINAL HYSTERECTOMY     BACK SURGERY     NASAL SINUS SURGERY     Past Surgical History:  Procedure Laterality Date   ABDOMINAL HYSTERECTOMY     BACK SURGERY     NASAL SINUS SURGERY     Past Medical History:  Diagnosis Date   Hypertension    Thyroid disease    BP (!) 144/77 (BP Location: Left Arm, Patient Position: Sitting, Cuff Size:  Large)   Pulse 88   Ht 5' 3 (1.6 m)   Wt 164 lb 12.8 oz (74.8 kg)   SpO2 98%   BMI 29.19 kg/m   Opioid Risk Score:   Fall Risk Score:  `1  Depression screen [facility] 2/9     02/13/2024   10:47 AM 12/12/2023   10:32 AM 10/13/2023    9:32 AM 06/11/2023    9:27 AM 02/07/2023    8:55 AM 11/08/2022    9:17 AM 08/09/2022    8:39 AM  Depression screen PHQ 2/9  Decreased Interest 1 1 0 1 1 1 1   Down, Depressed, Hopeless 1 1 0 1 1 1 1   PHQ - 2 Score 2 2 0 2 2 2 2       Review of Systems  Cardiovascular:        Hypertension  Musculoskeletal:  Positive for back pain, myalgias and neck pain.       Right shoulder pain radiating down right arm, neck pain, mid to lower back pain radiating down right leg to knee, right knee pain  All other systems reviewed and are negative.      Objective:   Physical Exam Vitals and nursing note reviewed.  Constitutional:      Appearance: Normal appearance.  Cardiovascular:     Rate and Rhythm: Normal rate and regular rhythm.     Pulses: Normal pulses.     Heart sounds: Normal heart sounds.  Pulmonary:     Effort: Pulmonary effort is normal.     Breath sounds: Normal breath sounds.  Musculoskeletal:     Comments: Normal Muscle Bulk and Muscle Testing Reveals:  Upper Extremities: Right: Decreased ROM 45 Degrees and Muscle Strength  5/5 Right AC Joint Tenderness Left: Upper Extremity: Full ROM and Muscle Strength 5/5 Thoracic Paraspinal Tenderness: T-1-T-4 Mainly Right Side  Lumbar Paraspinal Tenderness: L-3-L-5 Bilateral Greater Trochanter Tenderness Lower Extremities: Decreased ROM and Muscle Strength 5/5  Bilateral Lower extremities Flexion Produces Pain into her bilateral lower extremities Arises from Chair slowly Antalgic Gait     Skin:    General: Skin is warm and dry.  Neurological:     Mental Status: She is alert and oriented to person, place, and time.  Psychiatric:        Mood and Affect: Mood normal.        Behavior: Behavior normal.           Assessment & Plan:  Idiopathic Scoliosis/ Lumbar/ Lumbar Radiculitis: Continue HEP as Tolerated. Continue to Monitor. 04/16/2024 Myofascial Pain: Continue HEP as Tolerated. Continue to Monitor. 04/16/2024 Polyarthralgia: Continue HEP as Tolerated. Continue to Monitor. 04/16/2024  4. Chronic Pain Syndrome: Refilled: Hydrocodone  7.5/325 one tablet three times a day as needed for pain #90. We will continue the opioid monitoring program, this consists of regular clinic visits, examinations, urine drug screen, pill counts as well as use of [place]  Controlled Substance Reporting system. A 12 month History has been reviewed on the [place]  Controlled Substance Reporting System on 04/16/2024 5. Right Chronic Knee Pain: Continue HEP as Tolerated. Continue to Monitor. 04/16/2024 6. Right Shoulder Pain: She reports  Ortho Following . Continue to Monitor. 04/16/2024 F/U in 2 months

## 2024-04-20 LAB — DRUG TOX MONITOR 1 W/CONF, ORAL FLD
Amphetamines: NEGATIVE ng/mL (ref <10)
Barbiturates: NEGATIVE ng/mL (ref <10)
Benzodiazepines: NEGATIVE ng/mL (ref <0.50)
Buprenorphine: NEGATIVE ng/mL (ref <0.10)
Cocaine: NEGATIVE ng/mL (ref <5.0)
Codeine: NEGATIVE ng/mL (ref <2.5)
Dihydrocodeine: 8.7 ng/mL — ABNORMAL HIGH (ref <2.5)
Fentanyl: NEGATIVE ng/mL (ref <0.10)
Heroin Metabolite: NEGATIVE ng/mL (ref <1.0)
Hydrocodone: 84.4 ng/mL — ABNORMAL HIGH (ref <2.5)
Hydromorphone: NEGATIVE ng/mL (ref <2.5)
MARIJUANA: NEGATIVE ng/mL (ref <2.5)
MDMA: NEGATIVE ng/mL (ref <10)
Meprobamate: NEGATIVE ng/mL (ref <2.5)
Methadone: NEGATIVE ng/mL (ref <5.0)
Morphine: NEGATIVE ng/mL (ref <2.5)
Nicotine Metabolite: NEGATIVE ng/mL (ref <5.0)
Norhydrocodone: 5.4 ng/mL — ABNORMAL HIGH (ref <2.5)
Noroxycodone: NEGATIVE ng/mL (ref <2.5)
Opiates: POSITIVE ng/mL — AB (ref <2.5)
Oxycodone: NEGATIVE ng/mL (ref <2.5)
Oxymorphone: NEGATIVE ng/mL (ref <2.5)
Phencyclidine: NEGATIVE ng/mL (ref <10)
Tapentadol: NEGATIVE ng/mL (ref <5.0)
Tramadol: NEGATIVE ng/mL (ref <5.0)
Zolpidem: NEGATIVE ng/mL (ref <5.0)

## 2024-04-20 LAB — DRUG TOX ALC METAB W/CON, ORAL FLD: Alcohol Metabolite: NEGATIVE ng/mL (ref <25)

## 2024-05-10 ENCOUNTER — Other Ambulatory Visit: Payer: Self-pay

## 2024-05-12 ENCOUNTER — Other Ambulatory Visit: Payer: Self-pay

## 2024-05-13 ENCOUNTER — Other Ambulatory Visit: Payer: Self-pay

## 2024-05-24 ENCOUNTER — Other Ambulatory Visit: Payer: Self-pay

## 2024-06-06 ENCOUNTER — Other Ambulatory Visit: Payer: Self-pay

## 2024-06-07 ENCOUNTER — Ambulatory Visit: Admitting: Physical Medicine and Rehabilitation

## 2024-06-08 ENCOUNTER — Other Ambulatory Visit: Payer: Self-pay | Admitting: Registered Nurse

## 2024-06-08 DIAGNOSIS — G894 Chronic pain syndrome: Secondary | ICD-10-CM

## 2024-06-08 DIAGNOSIS — G8929 Other chronic pain: Secondary | ICD-10-CM

## 2024-06-09 ENCOUNTER — Other Ambulatory Visit: Payer: Self-pay | Admitting: Registered Nurse

## 2024-06-09 ENCOUNTER — Other Ambulatory Visit: Payer: Self-pay

## 2024-06-09 ENCOUNTER — Telehealth: Payer: Self-pay | Admitting: Registered Nurse

## 2024-06-09 ENCOUNTER — Telehealth: Payer: Self-pay | Admitting: Physical Medicine and Rehabilitation

## 2024-06-09 DIAGNOSIS — G8929 Other chronic pain: Secondary | ICD-10-CM

## 2024-06-09 DIAGNOSIS — M25511 Pain in right shoulder: Secondary | ICD-10-CM

## 2024-06-09 DIAGNOSIS — G894 Chronic pain syndrome: Secondary | ICD-10-CM

## 2024-06-09 MED ORDER — HYDROCODONE-ACETAMINOPHEN 7.5-325 MG PO TABS
1.0000 | ORAL_TABLET | Freq: Three times a day (TID) | ORAL | 0 refills | Status: AC | PRN
  Filled 2024-06-09: qty 90, 30d supply, fill #0

## 2024-06-09 MED ORDER — HYDROCODONE-ACETAMINOPHEN 7.5-325 MG PO TABS: 1.0000 | ORAL_TABLET | Freq: Four times a day (QID) | ORAL | 0 refills | Status: AC | PRN

## 2024-06-09 NOTE — Telephone Encounter (Signed)
 Prior Rx to [facility] has been cancelled. Dr. Cornelio sent a new one to [place].

## 2024-06-09 NOTE — Telephone Encounter (Signed)
 Pt daughter called that she mom's meds was sent to the wrong pharmacy. The correct place to send the meds is [facility]

## 2024-06-09 NOTE — Telephone Encounter (Signed)
 Pt, daughter called for her meds refill hydrocodone 

## 2024-06-10 ENCOUNTER — Other Ambulatory Visit: Payer: Self-pay

## 2024-06-10 NOTE — Telephone Encounter (Signed)
 Done

## 2024-06-16 ENCOUNTER — Ambulatory Visit: Admitting: Physical Medicine and Rehabilitation
